# Patient Record
Sex: Male | Born: 1935 | Race: White | Hispanic: No | Marital: Married | Smoking: Former smoker
Health system: Southern US, Community
[De-identification: ages and names within clinical notes are randomized; demographics above are authoritative.]

## PROBLEM LIST (undated history)

## (undated) DIAGNOSIS — K219 Gastro-esophageal reflux disease without esophagitis: Secondary | ICD-10-CM

## (undated) DIAGNOSIS — E538 Deficiency of other specified B group vitamins: Secondary | ICD-10-CM

## (undated) DIAGNOSIS — G4700 Insomnia, unspecified: Secondary | ICD-10-CM

## (undated) DIAGNOSIS — E559 Vitamin D deficiency, unspecified: Secondary | ICD-10-CM

## (undated) DIAGNOSIS — E119 Type 2 diabetes mellitus without complications: Secondary | ICD-10-CM

## (undated) DIAGNOSIS — M1990 Unspecified osteoarthritis, unspecified site: Secondary | ICD-10-CM

## (undated) DIAGNOSIS — Z79891 Long term (current) use of opiate analgesic: Secondary | ICD-10-CM

## (undated) DIAGNOSIS — R413 Other amnesia: Secondary | ICD-10-CM

## (undated) DIAGNOSIS — E785 Hyperlipidemia, unspecified: Secondary | ICD-10-CM

## (undated) HISTORY — DX: Other amnesia: R41.3

## (undated) HISTORY — DX: Deficiency of other specified B group vitamins: E53.8

## (undated) HISTORY — DX: Gastro-esophageal reflux disease without esophagitis: K21.9

## (undated) HISTORY — DX: Unspecified osteoarthritis, unspecified site: M19.90

## (undated) HISTORY — DX: Hyperlipidemia, unspecified: E78.5

## (undated) HISTORY — DX: Long term (current) use of opiate analgesic: Z79.891

## (undated) HISTORY — DX: Type 2 diabetes mellitus without complications: E11.9

## (undated) HISTORY — DX: Insomnia, unspecified: G47.00

## (undated) HISTORY — DX: Vitamin D deficiency, unspecified: E55.9

---
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
## 2006-02-11 HISTORY — PX: CHOLECYSTECTOMY: SHX55

## 2011-02-26 DIAGNOSIS — E785 Hyperlipidemia, unspecified: Secondary | ICD-10-CM | POA: Diagnosis not present

## 2011-02-26 DIAGNOSIS — E119 Type 2 diabetes mellitus without complications: Secondary | ICD-10-CM | POA: Diagnosis not present

## 2011-02-26 DIAGNOSIS — I10 Essential (primary) hypertension: Secondary | ICD-10-CM | POA: Diagnosis not present

## 2011-02-26 DIAGNOSIS — Z6835 Body mass index (BMI) 35.0-35.9, adult: Secondary | ICD-10-CM | POA: Diagnosis not present

## 2011-02-26 DIAGNOSIS — Z125 Encounter for screening for malignant neoplasm of prostate: Secondary | ICD-10-CM | POA: Diagnosis not present

## 2011-02-26 DIAGNOSIS — G459 Transient cerebral ischemic attack, unspecified: Secondary | ICD-10-CM | POA: Diagnosis not present

## 2011-02-28 DIAGNOSIS — G459 Transient cerebral ischemic attack, unspecified: Secondary | ICD-10-CM | POA: Diagnosis not present

## 2011-12-20 DIAGNOSIS — Z23 Encounter for immunization: Secondary | ICD-10-CM | POA: Diagnosis not present

## 2011-12-20 DIAGNOSIS — M818 Other osteoporosis without current pathological fracture: Secondary | ICD-10-CM | POA: Diagnosis not present

## 2011-12-20 DIAGNOSIS — E785 Hyperlipidemia, unspecified: Secondary | ICD-10-CM | POA: Diagnosis not present

## 2011-12-20 DIAGNOSIS — D485 Neoplasm of uncertain behavior of skin: Secondary | ICD-10-CM | POA: Diagnosis not present

## 2011-12-20 DIAGNOSIS — E538 Deficiency of other specified B group vitamins: Secondary | ICD-10-CM | POA: Diagnosis not present

## 2011-12-20 DIAGNOSIS — R42 Dizziness and giddiness: Secondary | ICD-10-CM | POA: Diagnosis not present

## 2012-01-23 DIAGNOSIS — E881 Lipodystrophy, not elsewhere classified: Secondary | ICD-10-CM | POA: Diagnosis not present

## 2012-01-23 DIAGNOSIS — R7989 Other specified abnormal findings of blood chemistry: Secondary | ICD-10-CM | POA: Diagnosis not present

## 2012-01-23 DIAGNOSIS — E785 Hyperlipidemia, unspecified: Secondary | ICD-10-CM | POA: Diagnosis not present

## 2012-01-23 DIAGNOSIS — Z23 Encounter for immunization: Secondary | ICD-10-CM | POA: Diagnosis not present

## 2012-03-24 DIAGNOSIS — R7309 Other abnormal glucose: Secondary | ICD-10-CM | POA: Diagnosis not present

## 2012-03-24 DIAGNOSIS — E119 Type 2 diabetes mellitus without complications: Secondary | ICD-10-CM | POA: Diagnosis not present

## 2012-03-24 DIAGNOSIS — E785 Hyperlipidemia, unspecified: Secondary | ICD-10-CM | POA: Diagnosis not present

## 2012-04-04 DIAGNOSIS — E119 Type 2 diabetes mellitus without complications: Secondary | ICD-10-CM | POA: Diagnosis not present

## 2012-04-04 DIAGNOSIS — R300 Dysuria: Secondary | ICD-10-CM | POA: Diagnosis not present

## 2012-04-04 DIAGNOSIS — N135 Crossing vessel and stricture of ureter without hydronephrosis: Secondary | ICD-10-CM | POA: Diagnosis not present

## 2012-04-04 DIAGNOSIS — N1330 Unspecified hydronephrosis: Secondary | ICD-10-CM | POA: Diagnosis not present

## 2012-04-04 DIAGNOSIS — N209 Urinary calculus, unspecified: Secondary | ICD-10-CM | POA: Diagnosis not present

## 2012-04-04 DIAGNOSIS — R109 Unspecified abdominal pain: Secondary | ICD-10-CM | POA: Diagnosis not present

## 2012-04-04 DIAGNOSIS — N201 Calculus of ureter: Secondary | ICD-10-CM | POA: Diagnosis not present

## 2012-04-04 DIAGNOSIS — E860 Dehydration: Secondary | ICD-10-CM | POA: Diagnosis not present

## 2012-04-07 DIAGNOSIS — N200 Calculus of kidney: Secondary | ICD-10-CM | POA: Diagnosis not present

## 2012-04-07 DIAGNOSIS — R109 Unspecified abdominal pain: Secondary | ICD-10-CM | POA: Diagnosis not present

## 2012-04-10 DIAGNOSIS — E119 Type 2 diabetes mellitus without complications: Secondary | ICD-10-CM | POA: Diagnosis not present

## 2012-04-10 DIAGNOSIS — E669 Obesity, unspecified: Secondary | ICD-10-CM | POA: Diagnosis not present

## 2012-04-10 DIAGNOSIS — Z79899 Other long term (current) drug therapy: Secondary | ICD-10-CM | POA: Diagnosis not present

## 2012-04-10 DIAGNOSIS — N200 Calculus of kidney: Secondary | ICD-10-CM | POA: Diagnosis not present

## 2012-04-21 DIAGNOSIS — N200 Calculus of kidney: Secondary | ICD-10-CM | POA: Diagnosis not present

## 2012-04-21 DIAGNOSIS — N400 Enlarged prostate without lower urinary tract symptoms: Secondary | ICD-10-CM | POA: Diagnosis not present

## 2012-05-12 DIAGNOSIS — N400 Enlarged prostate without lower urinary tract symptoms: Secondary | ICD-10-CM | POA: Diagnosis not present

## 2012-05-12 DIAGNOSIS — N200 Calculus of kidney: Secondary | ICD-10-CM | POA: Diagnosis not present

## 2012-06-11 DIAGNOSIS — E119 Type 2 diabetes mellitus without complications: Secondary | ICD-10-CM | POA: Diagnosis not present

## 2012-06-11 DIAGNOSIS — H2589 Other age-related cataract: Secondary | ICD-10-CM | POA: Diagnosis not present

## 2012-06-23 DIAGNOSIS — E785 Hyperlipidemia, unspecified: Secondary | ICD-10-CM | POA: Diagnosis not present

## 2012-06-23 DIAGNOSIS — E119 Type 2 diabetes mellitus without complications: Secondary | ICD-10-CM | POA: Diagnosis not present

## 2012-08-24 DIAGNOSIS — N200 Calculus of kidney: Secondary | ICD-10-CM | POA: Diagnosis not present

## 2012-08-24 DIAGNOSIS — Z125 Encounter for screening for malignant neoplasm of prostate: Secondary | ICD-10-CM | POA: Diagnosis not present

## 2012-08-24 DIAGNOSIS — N400 Enlarged prostate without lower urinary tract symptoms: Secondary | ICD-10-CM | POA: Diagnosis not present

## 2012-09-23 DIAGNOSIS — E119 Type 2 diabetes mellitus without complications: Secondary | ICD-10-CM | POA: Diagnosis not present

## 2012-09-23 DIAGNOSIS — G459 Transient cerebral ischemic attack, unspecified: Secondary | ICD-10-CM | POA: Diagnosis not present

## 2012-09-23 DIAGNOSIS — N200 Calculus of kidney: Secondary | ICD-10-CM | POA: Diagnosis not present

## 2012-09-23 DIAGNOSIS — E785 Hyperlipidemia, unspecified: Secondary | ICD-10-CM | POA: Diagnosis not present

## 2012-11-30 DIAGNOSIS — L570 Actinic keratosis: Secondary | ICD-10-CM | POA: Diagnosis not present

## 2012-11-30 DIAGNOSIS — L578 Other skin changes due to chronic exposure to nonionizing radiation: Secondary | ICD-10-CM | POA: Diagnosis not present

## 2012-12-25 DIAGNOSIS — E119 Type 2 diabetes mellitus without complications: Secondary | ICD-10-CM | POA: Diagnosis not present

## 2012-12-25 DIAGNOSIS — Z23 Encounter for immunization: Secondary | ICD-10-CM | POA: Diagnosis not present

## 2012-12-25 DIAGNOSIS — E785 Hyperlipidemia, unspecified: Secondary | ICD-10-CM | POA: Diagnosis not present

## 2013-03-29 DIAGNOSIS — E785 Hyperlipidemia, unspecified: Secondary | ICD-10-CM | POA: Diagnosis not present

## 2013-03-29 DIAGNOSIS — E119 Type 2 diabetes mellitus without complications: Secondary | ICD-10-CM | POA: Diagnosis not present

## 2013-06-29 DIAGNOSIS — E785 Hyperlipidemia, unspecified: Secondary | ICD-10-CM | POA: Diagnosis not present

## 2013-06-29 DIAGNOSIS — M545 Low back pain, unspecified: Secondary | ICD-10-CM | POA: Diagnosis not present

## 2013-06-29 DIAGNOSIS — Z6834 Body mass index (BMI) 34.0-34.9, adult: Secondary | ICD-10-CM | POA: Diagnosis not present

## 2013-06-29 DIAGNOSIS — E119 Type 2 diabetes mellitus without complications: Secondary | ICD-10-CM | POA: Diagnosis not present

## 2013-09-29 DIAGNOSIS — Z125 Encounter for screening for malignant neoplasm of prostate: Secondary | ICD-10-CM | POA: Diagnosis not present

## 2013-09-29 DIAGNOSIS — E119 Type 2 diabetes mellitus without complications: Secondary | ICD-10-CM | POA: Diagnosis not present

## 2013-09-29 DIAGNOSIS — M25519 Pain in unspecified shoulder: Secondary | ICD-10-CM | POA: Diagnosis not present

## 2013-09-29 DIAGNOSIS — E785 Hyperlipidemia, unspecified: Secondary | ICD-10-CM | POA: Diagnosis not present

## 2013-09-29 DIAGNOSIS — Z0000 Encounter for general adult medical examination without abnormal findings: Secondary | ICD-10-CM | POA: Diagnosis not present

## 2013-09-30 DIAGNOSIS — E119 Type 2 diabetes mellitus without complications: Secondary | ICD-10-CM | POA: Diagnosis not present

## 2013-10-04 DIAGNOSIS — H40039 Anatomical narrow angle, unspecified eye: Secondary | ICD-10-CM | POA: Diagnosis not present

## 2013-10-04 DIAGNOSIS — H2510 Age-related nuclear cataract, unspecified eye: Secondary | ICD-10-CM | POA: Diagnosis not present

## 2013-10-04 DIAGNOSIS — H2589 Other age-related cataract: Secondary | ICD-10-CM | POA: Diagnosis not present

## 2013-11-10 DIAGNOSIS — H2589 Other age-related cataract: Secondary | ICD-10-CM | POA: Diagnosis not present

## 2013-11-10 DIAGNOSIS — H2510 Age-related nuclear cataract, unspecified eye: Secondary | ICD-10-CM | POA: Diagnosis not present

## 2013-12-30 DIAGNOSIS — M25511 Pain in right shoulder: Secondary | ICD-10-CM | POA: Diagnosis not present

## 2013-12-30 DIAGNOSIS — E785 Hyperlipidemia, unspecified: Secondary | ICD-10-CM | POA: Diagnosis not present

## 2013-12-30 DIAGNOSIS — E119 Type 2 diabetes mellitus without complications: Secondary | ICD-10-CM | POA: Diagnosis not present

## 2013-12-30 DIAGNOSIS — G4700 Insomnia, unspecified: Secondary | ICD-10-CM | POA: Diagnosis not present

## 2014-03-01 DIAGNOSIS — J189 Pneumonia, unspecified organism: Secondary | ICD-10-CM | POA: Diagnosis not present

## 2014-03-01 DIAGNOSIS — E119 Type 2 diabetes mellitus without complications: Secondary | ICD-10-CM | POA: Diagnosis not present

## 2014-03-01 DIAGNOSIS — E785 Hyperlipidemia, unspecified: Secondary | ICD-10-CM | POA: Diagnosis not present

## 2014-03-14 DIAGNOSIS — R079 Chest pain, unspecified: Secondary | ICD-10-CM | POA: Diagnosis not present

## 2014-03-14 DIAGNOSIS — F419 Anxiety disorder, unspecified: Secondary | ICD-10-CM | POA: Diagnosis not present

## 2014-03-14 DIAGNOSIS — R0602 Shortness of breath: Secondary | ICD-10-CM | POA: Diagnosis not present

## 2014-03-15 DIAGNOSIS — R791 Abnormal coagulation profile: Secondary | ICD-10-CM | POA: Diagnosis not present

## 2014-03-15 DIAGNOSIS — I313 Pericardial effusion (noninflammatory): Secondary | ICD-10-CM | POA: Diagnosis not present

## 2014-03-15 DIAGNOSIS — I517 Cardiomegaly: Secondary | ICD-10-CM | POA: Diagnosis not present

## 2014-03-15 DIAGNOSIS — R0602 Shortness of breath: Secondary | ICD-10-CM | POA: Diagnosis not present

## 2014-03-17 DIAGNOSIS — R0789 Other chest pain: Secondary | ICD-10-CM | POA: Diagnosis not present

## 2014-03-17 DIAGNOSIS — R079 Chest pain, unspecified: Secondary | ICD-10-CM | POA: Diagnosis not present

## 2014-04-01 DIAGNOSIS — J9801 Acute bronchospasm: Secondary | ICD-10-CM | POA: Diagnosis not present

## 2014-04-01 DIAGNOSIS — M1990 Unspecified osteoarthritis, unspecified site: Secondary | ICD-10-CM | POA: Diagnosis not present

## 2014-04-01 DIAGNOSIS — E785 Hyperlipidemia, unspecified: Secondary | ICD-10-CM | POA: Diagnosis not present

## 2014-04-01 DIAGNOSIS — E119 Type 2 diabetes mellitus without complications: Secondary | ICD-10-CM | POA: Diagnosis not present

## 2014-04-01 DIAGNOSIS — G4700 Insomnia, unspecified: Secondary | ICD-10-CM | POA: Diagnosis not present

## 2014-04-20 DIAGNOSIS — S2341XA Sprain of ribs, initial encounter: Secondary | ICD-10-CM | POA: Diagnosis not present

## 2014-04-20 DIAGNOSIS — S20219A Contusion of unspecified front wall of thorax, initial encounter: Secondary | ICD-10-CM | POA: Diagnosis not present

## 2014-04-20 DIAGNOSIS — R071 Chest pain on breathing: Secondary | ICD-10-CM | POA: Diagnosis not present

## 2014-04-20 DIAGNOSIS — I517 Cardiomegaly: Secondary | ICD-10-CM | POA: Diagnosis not present

## 2014-06-29 DIAGNOSIS — R7309 Other abnormal glucose: Secondary | ICD-10-CM | POA: Diagnosis not present

## 2014-06-29 DIAGNOSIS — E785 Hyperlipidemia, unspecified: Secondary | ICD-10-CM | POA: Diagnosis not present

## 2014-06-29 DIAGNOSIS — Z6834 Body mass index (BMI) 34.0-34.9, adult: Secondary | ICD-10-CM | POA: Diagnosis not present

## 2014-07-01 DIAGNOSIS — E119 Type 2 diabetes mellitus without complications: Secondary | ICD-10-CM | POA: Diagnosis not present

## 2014-07-01 DIAGNOSIS — E785 Hyperlipidemia, unspecified: Secondary | ICD-10-CM | POA: Diagnosis not present

## 2014-10-07 DIAGNOSIS — Z79899 Other long term (current) drug therapy: Secondary | ICD-10-CM | POA: Diagnosis not present

## 2014-10-07 DIAGNOSIS — M1990 Unspecified osteoarthritis, unspecified site: Secondary | ICD-10-CM | POA: Diagnosis not present

## 2014-10-07 DIAGNOSIS — Z1389 Encounter for screening for other disorder: Secondary | ICD-10-CM | POA: Diagnosis not present

## 2014-10-07 DIAGNOSIS — E785 Hyperlipidemia, unspecified: Secondary | ICD-10-CM | POA: Diagnosis not present

## 2014-10-07 DIAGNOSIS — E119 Type 2 diabetes mellitus without complications: Secondary | ICD-10-CM | POA: Diagnosis not present

## 2014-10-07 DIAGNOSIS — M25511 Pain in right shoulder: Secondary | ICD-10-CM | POA: Diagnosis not present

## 2014-10-07 DIAGNOSIS — M818 Other osteoporosis without current pathological fracture: Secondary | ICD-10-CM | POA: Diagnosis not present

## 2014-10-07 DIAGNOSIS — Z9181 History of falling: Secondary | ICD-10-CM | POA: Diagnosis not present

## 2014-10-10 DIAGNOSIS — E119 Type 2 diabetes mellitus without complications: Secondary | ICD-10-CM | POA: Diagnosis not present

## 2014-11-09 DIAGNOSIS — E538 Deficiency of other specified B group vitamins: Secondary | ICD-10-CM | POA: Diagnosis not present

## 2014-11-18 DIAGNOSIS — E669 Obesity, unspecified: Secondary | ICD-10-CM | POA: Diagnosis not present

## 2014-11-18 DIAGNOSIS — R079 Chest pain, unspecified: Secondary | ICD-10-CM | POA: Diagnosis not present

## 2014-11-18 DIAGNOSIS — R072 Precordial pain: Secondary | ICD-10-CM | POA: Diagnosis not present

## 2014-11-18 DIAGNOSIS — N189 Chronic kidney disease, unspecified: Secondary | ICD-10-CM | POA: Diagnosis not present

## 2014-11-18 DIAGNOSIS — Z7982 Long term (current) use of aspirin: Secondary | ICD-10-CM | POA: Diagnosis not present

## 2014-11-18 DIAGNOSIS — Z79899 Other long term (current) drug therapy: Secondary | ICD-10-CM | POA: Diagnosis not present

## 2014-11-18 DIAGNOSIS — I249 Acute ischemic heart disease, unspecified: Secondary | ICD-10-CM | POA: Diagnosis not present

## 2014-11-18 DIAGNOSIS — I129 Hypertensive chronic kidney disease with stage 1 through stage 4 chronic kidney disease, or unspecified chronic kidney disease: Secondary | ICD-10-CM | POA: Diagnosis not present

## 2014-11-18 DIAGNOSIS — E119 Type 2 diabetes mellitus without complications: Secondary | ICD-10-CM | POA: Diagnosis not present

## 2014-11-18 DIAGNOSIS — E785 Hyperlipidemia, unspecified: Secondary | ICD-10-CM | POA: Diagnosis not present

## 2014-11-18 DIAGNOSIS — R509 Fever, unspecified: Secondary | ICD-10-CM | POA: Diagnosis not present

## 2014-11-19 DIAGNOSIS — N189 Chronic kidney disease, unspecified: Secondary | ICD-10-CM | POA: Diagnosis not present

## 2014-11-19 DIAGNOSIS — I447 Left bundle-branch block, unspecified: Secondary | ICD-10-CM | POA: Diagnosis not present

## 2014-11-19 DIAGNOSIS — R079 Chest pain, unspecified: Secondary | ICD-10-CM | POA: Diagnosis not present

## 2014-11-19 DIAGNOSIS — E785 Hyperlipidemia, unspecified: Secondary | ICD-10-CM | POA: Diagnosis not present

## 2014-11-19 DIAGNOSIS — E119 Type 2 diabetes mellitus without complications: Secondary | ICD-10-CM | POA: Diagnosis not present

## 2014-11-21 DIAGNOSIS — Z23 Encounter for immunization: Secondary | ICD-10-CM | POA: Diagnosis not present

## 2014-12-09 DIAGNOSIS — E538 Deficiency of other specified B group vitamins: Secondary | ICD-10-CM | POA: Diagnosis not present

## 2014-12-12 DIAGNOSIS — R072 Precordial pain: Secondary | ICD-10-CM

## 2014-12-12 DIAGNOSIS — I447 Left bundle-branch block, unspecified: Secondary | ICD-10-CM | POA: Insufficient documentation

## 2014-12-12 DIAGNOSIS — R0789 Other chest pain: Secondary | ICD-10-CM | POA: Diagnosis not present

## 2014-12-12 DIAGNOSIS — E785 Hyperlipidemia, unspecified: Secondary | ICD-10-CM | POA: Insufficient documentation

## 2014-12-12 DIAGNOSIS — E119 Type 2 diabetes mellitus without complications: Secondary | ICD-10-CM | POA: Diagnosis not present

## 2014-12-12 HISTORY — DX: Precordial pain: R07.2

## 2014-12-12 HISTORY — DX: Hyperlipidemia, unspecified: E78.5

## 2014-12-22 DIAGNOSIS — I447 Left bundle-branch block, unspecified: Secondary | ICD-10-CM | POA: Diagnosis not present

## 2014-12-22 DIAGNOSIS — R072 Precordial pain: Secondary | ICD-10-CM | POA: Diagnosis not present

## 2015-01-09 DIAGNOSIS — Z6834 Body mass index (BMI) 34.0-34.9, adult: Secondary | ICD-10-CM | POA: Diagnosis not present

## 2015-01-09 DIAGNOSIS — E785 Hyperlipidemia, unspecified: Secondary | ICD-10-CM | POA: Diagnosis not present

## 2015-01-09 DIAGNOSIS — E119 Type 2 diabetes mellitus without complications: Secondary | ICD-10-CM | POA: Diagnosis not present

## 2015-01-12 DIAGNOSIS — R072 Precordial pain: Secondary | ICD-10-CM | POA: Diagnosis not present

## 2015-01-12 DIAGNOSIS — I447 Left bundle-branch block, unspecified: Secondary | ICD-10-CM | POA: Diagnosis not present

## 2015-01-12 DIAGNOSIS — N529 Male erectile dysfunction, unspecified: Secondary | ICD-10-CM

## 2015-01-12 DIAGNOSIS — E119 Type 2 diabetes mellitus without complications: Secondary | ICD-10-CM | POA: Diagnosis not present

## 2015-01-12 DIAGNOSIS — E785 Hyperlipidemia, unspecified: Secondary | ICD-10-CM | POA: Diagnosis not present

## 2015-01-12 HISTORY — DX: Male erectile dysfunction, unspecified: N52.9

## 2015-01-20 DIAGNOSIS — E785 Hyperlipidemia, unspecified: Secondary | ICD-10-CM | POA: Diagnosis not present

## 2015-01-20 DIAGNOSIS — E119 Type 2 diabetes mellitus without complications: Secondary | ICD-10-CM | POA: Diagnosis not present

## 2015-03-21 DIAGNOSIS — Z6835 Body mass index (BMI) 35.0-35.9, adult: Secondary | ICD-10-CM | POA: Diagnosis not present

## 2015-03-21 DIAGNOSIS — R6889 Other general symptoms and signs: Secondary | ICD-10-CM | POA: Diagnosis not present

## 2015-03-21 DIAGNOSIS — G4700 Insomnia, unspecified: Secondary | ICD-10-CM | POA: Diagnosis not present

## 2015-03-21 DIAGNOSIS — J101 Influenza due to other identified influenza virus with other respiratory manifestations: Secondary | ICD-10-CM | POA: Diagnosis not present

## 2015-03-21 DIAGNOSIS — R0602 Shortness of breath: Secondary | ICD-10-CM | POA: Diagnosis not present

## 2015-03-21 DIAGNOSIS — J1108 Influenza due to unidentified influenza virus with specified pneumonia: Secondary | ICD-10-CM | POA: Diagnosis not present

## 2015-03-21 DIAGNOSIS — J189 Pneumonia, unspecified organism: Secondary | ICD-10-CM | POA: Diagnosis not present

## 2015-03-27 DIAGNOSIS — G4700 Insomnia, unspecified: Secondary | ICD-10-CM | POA: Diagnosis not present

## 2015-03-27 DIAGNOSIS — F419 Anxiety disorder, unspecified: Secondary | ICD-10-CM | POA: Diagnosis not present

## 2015-03-27 DIAGNOSIS — Z7189 Other specified counseling: Secondary | ICD-10-CM | POA: Diagnosis not present

## 2015-03-27 DIAGNOSIS — J189 Pneumonia, unspecified organism: Secondary | ICD-10-CM | POA: Diagnosis not present

## 2015-04-11 DIAGNOSIS — E119 Type 2 diabetes mellitus without complications: Secondary | ICD-10-CM | POA: Diagnosis not present

## 2015-04-11 DIAGNOSIS — Z6835 Body mass index (BMI) 35.0-35.9, adult: Secondary | ICD-10-CM | POA: Diagnosis not present

## 2015-04-11 DIAGNOSIS — E669 Obesity, unspecified: Secondary | ICD-10-CM | POA: Diagnosis not present

## 2015-04-11 DIAGNOSIS — Z79899 Other long term (current) drug therapy: Secondary | ICD-10-CM | POA: Diagnosis not present

## 2015-04-11 DIAGNOSIS — Z125 Encounter for screening for malignant neoplasm of prostate: Secondary | ICD-10-CM | POA: Diagnosis not present

## 2015-04-11 DIAGNOSIS — E785 Hyperlipidemia, unspecified: Secondary | ICD-10-CM | POA: Diagnosis not present

## 2015-04-18 DIAGNOSIS — E538 Deficiency of other specified B group vitamins: Secondary | ICD-10-CM | POA: Diagnosis not present

## 2015-05-15 DIAGNOSIS — B3781 Candidal esophagitis: Secondary | ICD-10-CM | POA: Diagnosis not present

## 2015-05-15 DIAGNOSIS — E538 Deficiency of other specified B group vitamins: Secondary | ICD-10-CM | POA: Diagnosis not present

## 2015-06-22 DIAGNOSIS — E119 Type 2 diabetes mellitus without complications: Secondary | ICD-10-CM | POA: Diagnosis not present

## 2015-06-30 DIAGNOSIS — E119 Type 2 diabetes mellitus without complications: Secondary | ICD-10-CM | POA: Diagnosis not present

## 2015-06-30 DIAGNOSIS — J189 Pneumonia, unspecified organism: Secondary | ICD-10-CM | POA: Diagnosis not present

## 2015-06-30 DIAGNOSIS — J9801 Acute bronchospasm: Secondary | ICD-10-CM | POA: Diagnosis not present

## 2015-06-30 DIAGNOSIS — E785 Hyperlipidemia, unspecified: Secondary | ICD-10-CM | POA: Diagnosis not present

## 2015-07-11 DIAGNOSIS — R0602 Shortness of breath: Secondary | ICD-10-CM | POA: Diagnosis not present

## 2015-07-11 DIAGNOSIS — G4700 Insomnia, unspecified: Secondary | ICD-10-CM | POA: Diagnosis not present

## 2015-07-11 DIAGNOSIS — Z6834 Body mass index (BMI) 34.0-34.9, adult: Secondary | ICD-10-CM | POA: Diagnosis not present

## 2015-07-11 DIAGNOSIS — E785 Hyperlipidemia, unspecified: Secondary | ICD-10-CM | POA: Diagnosis not present

## 2015-07-11 DIAGNOSIS — J189 Pneumonia, unspecified organism: Secondary | ICD-10-CM | POA: Diagnosis not present

## 2015-07-11 DIAGNOSIS — L570 Actinic keratosis: Secondary | ICD-10-CM | POA: Diagnosis not present

## 2015-07-11 DIAGNOSIS — J449 Chronic obstructive pulmonary disease, unspecified: Secondary | ICD-10-CM | POA: Diagnosis not present

## 2015-07-11 DIAGNOSIS — E119 Type 2 diabetes mellitus without complications: Secondary | ICD-10-CM | POA: Diagnosis not present

## 2015-08-04 DIAGNOSIS — Z0000 Encounter for general adult medical examination without abnormal findings: Secondary | ICD-10-CM | POA: Diagnosis not present

## 2015-08-04 DIAGNOSIS — Z23 Encounter for immunization: Secondary | ICD-10-CM | POA: Diagnosis not present

## 2015-08-04 DIAGNOSIS — E785 Hyperlipidemia, unspecified: Secondary | ICD-10-CM | POA: Diagnosis not present

## 2015-08-04 DIAGNOSIS — E669 Obesity, unspecified: Secondary | ICD-10-CM | POA: Diagnosis not present

## 2015-08-04 DIAGNOSIS — Z6834 Body mass index (BMI) 34.0-34.9, adult: Secondary | ICD-10-CM | POA: Diagnosis not present

## 2015-10-12 DIAGNOSIS — M1990 Unspecified osteoarthritis, unspecified site: Secondary | ICD-10-CM | POA: Diagnosis not present

## 2015-10-12 DIAGNOSIS — E669 Obesity, unspecified: Secondary | ICD-10-CM | POA: Diagnosis not present

## 2015-10-12 DIAGNOSIS — E785 Hyperlipidemia, unspecified: Secondary | ICD-10-CM | POA: Diagnosis not present

## 2015-10-12 DIAGNOSIS — Z1389 Encounter for screening for other disorder: Secondary | ICD-10-CM | POA: Diagnosis not present

## 2015-10-12 DIAGNOSIS — G4700 Insomnia, unspecified: Secondary | ICD-10-CM | POA: Diagnosis not present

## 2015-10-12 DIAGNOSIS — Z6833 Body mass index (BMI) 33.0-33.9, adult: Secondary | ICD-10-CM | POA: Diagnosis not present

## 2015-10-12 DIAGNOSIS — E119 Type 2 diabetes mellitus without complications: Secondary | ICD-10-CM | POA: Diagnosis not present

## 2015-10-12 DIAGNOSIS — Z9181 History of falling: Secondary | ICD-10-CM | POA: Diagnosis not present

## 2015-12-26 DIAGNOSIS — H353131 Nonexudative age-related macular degeneration, bilateral, early dry stage: Secondary | ICD-10-CM | POA: Diagnosis not present

## 2015-12-26 DIAGNOSIS — E119 Type 2 diabetes mellitus without complications: Secondary | ICD-10-CM | POA: Diagnosis not present

## 2015-12-26 DIAGNOSIS — Z961 Presence of intraocular lens: Secondary | ICD-10-CM | POA: Diagnosis not present

## 2016-01-12 DIAGNOSIS — Z6833 Body mass index (BMI) 33.0-33.9, adult: Secondary | ICD-10-CM | POA: Diagnosis not present

## 2016-01-12 DIAGNOSIS — G4700 Insomnia, unspecified: Secondary | ICD-10-CM | POA: Diagnosis not present

## 2016-01-12 DIAGNOSIS — E119 Type 2 diabetes mellitus without complications: Secondary | ICD-10-CM | POA: Diagnosis not present

## 2016-01-12 DIAGNOSIS — E785 Hyperlipidemia, unspecified: Secondary | ICD-10-CM | POA: Diagnosis not present

## 2016-01-12 DIAGNOSIS — Z23 Encounter for immunization: Secondary | ICD-10-CM | POA: Diagnosis not present

## 2016-01-12 DIAGNOSIS — N529 Male erectile dysfunction, unspecified: Secondary | ICD-10-CM | POA: Diagnosis not present

## 2016-01-12 DIAGNOSIS — E669 Obesity, unspecified: Secondary | ICD-10-CM | POA: Diagnosis not present

## 2016-01-25 DIAGNOSIS — B349 Viral infection, unspecified: Secondary | ICD-10-CM | POA: Diagnosis not present

## 2016-01-25 DIAGNOSIS — Z6833 Body mass index (BMI) 33.0-33.9, adult: Secondary | ICD-10-CM | POA: Diagnosis not present

## 2016-01-25 DIAGNOSIS — J189 Pneumonia, unspecified organism: Secondary | ICD-10-CM | POA: Diagnosis not present

## 2016-01-25 DIAGNOSIS — J301 Allergic rhinitis due to pollen: Secondary | ICD-10-CM | POA: Diagnosis not present

## 2016-03-14 DIAGNOSIS — R6889 Other general symptoms and signs: Secondary | ICD-10-CM | POA: Diagnosis not present

## 2016-03-14 DIAGNOSIS — Z6833 Body mass index (BMI) 33.0-33.9, adult: Secondary | ICD-10-CM | POA: Diagnosis not present

## 2016-03-14 DIAGNOSIS — J189 Pneumonia, unspecified organism: Secondary | ICD-10-CM | POA: Diagnosis not present

## 2016-03-14 DIAGNOSIS — J111 Influenza due to unidentified influenza virus with other respiratory manifestations: Secondary | ICD-10-CM | POA: Diagnosis not present

## 2016-04-12 DIAGNOSIS — E538 Deficiency of other specified B group vitamins: Secondary | ICD-10-CM | POA: Diagnosis not present

## 2016-04-12 DIAGNOSIS — E119 Type 2 diabetes mellitus without complications: Secondary | ICD-10-CM | POA: Diagnosis not present

## 2016-04-12 DIAGNOSIS — Z79899 Other long term (current) drug therapy: Secondary | ICD-10-CM | POA: Diagnosis not present

## 2016-04-12 DIAGNOSIS — E785 Hyperlipidemia, unspecified: Secondary | ICD-10-CM | POA: Diagnosis not present

## 2016-04-12 DIAGNOSIS — G4700 Insomnia, unspecified: Secondary | ICD-10-CM | POA: Diagnosis not present

## 2016-04-12 DIAGNOSIS — Z6833 Body mass index (BMI) 33.0-33.9, adult: Secondary | ICD-10-CM | POA: Diagnosis not present

## 2016-07-15 DIAGNOSIS — E538 Deficiency of other specified B group vitamins: Secondary | ICD-10-CM | POA: Diagnosis not present

## 2016-07-15 DIAGNOSIS — E785 Hyperlipidemia, unspecified: Secondary | ICD-10-CM | POA: Diagnosis not present

## 2016-07-15 DIAGNOSIS — G4700 Insomnia, unspecified: Secondary | ICD-10-CM | POA: Diagnosis not present

## 2016-07-15 DIAGNOSIS — Z125 Encounter for screening for malignant neoplasm of prostate: Secondary | ICD-10-CM | POA: Diagnosis not present

## 2016-07-15 DIAGNOSIS — E119 Type 2 diabetes mellitus without complications: Secondary | ICD-10-CM | POA: Diagnosis not present

## 2016-07-15 DIAGNOSIS — Z6833 Body mass index (BMI) 33.0-33.9, adult: Secondary | ICD-10-CM | POA: Diagnosis not present

## 2016-09-02 DIAGNOSIS — E785 Hyperlipidemia, unspecified: Secondary | ICD-10-CM | POA: Diagnosis not present

## 2016-09-02 DIAGNOSIS — E119 Type 2 diabetes mellitus without complications: Secondary | ICD-10-CM | POA: Diagnosis not present

## 2016-09-02 DIAGNOSIS — E669 Obesity, unspecified: Secondary | ICD-10-CM | POA: Diagnosis not present

## 2016-09-02 DIAGNOSIS — Z6833 Body mass index (BMI) 33.0-33.9, adult: Secondary | ICD-10-CM | POA: Diagnosis not present

## 2016-09-02 DIAGNOSIS — I200 Unstable angina: Secondary | ICD-10-CM | POA: Diagnosis not present

## 2016-09-02 DIAGNOSIS — Z79899 Other long term (current) drug therapy: Secondary | ICD-10-CM | POA: Diagnosis not present

## 2016-09-02 DIAGNOSIS — R079 Chest pain, unspecified: Secondary | ICD-10-CM | POA: Diagnosis not present

## 2016-09-03 DIAGNOSIS — E119 Type 2 diabetes mellitus without complications: Secondary | ICD-10-CM | POA: Diagnosis not present

## 2016-09-03 DIAGNOSIS — E785 Hyperlipidemia, unspecified: Secondary | ICD-10-CM | POA: Diagnosis not present

## 2016-09-03 DIAGNOSIS — R7989 Other specified abnormal findings of blood chemistry: Secondary | ICD-10-CM | POA: Diagnosis not present

## 2016-09-03 DIAGNOSIS — R079 Chest pain, unspecified: Secondary | ICD-10-CM | POA: Diagnosis not present

## 2016-09-04 DIAGNOSIS — F419 Anxiety disorder, unspecified: Secondary | ICD-10-CM | POA: Diagnosis not present

## 2016-09-04 DIAGNOSIS — E119 Type 2 diabetes mellitus without complications: Secondary | ICD-10-CM | POA: Diagnosis not present

## 2016-09-04 DIAGNOSIS — Z79899 Other long term (current) drug therapy: Secondary | ICD-10-CM | POA: Diagnosis not present

## 2016-09-04 DIAGNOSIS — N529 Male erectile dysfunction, unspecified: Secondary | ICD-10-CM | POA: Diagnosis not present

## 2016-09-04 DIAGNOSIS — Z6832 Body mass index (BMI) 32.0-32.9, adult: Secondary | ICD-10-CM | POA: Diagnosis not present

## 2016-09-04 DIAGNOSIS — E785 Hyperlipidemia, unspecified: Secondary | ICD-10-CM | POA: Diagnosis not present

## 2016-09-04 DIAGNOSIS — J9801 Acute bronchospasm: Secondary | ICD-10-CM | POA: Diagnosis not present

## 2016-09-04 DIAGNOSIS — S22030A Wedge compression fracture of third thoracic vertebra, initial encounter for closed fracture: Secondary | ICD-10-CM | POA: Diagnosis not present

## 2016-10-16 DIAGNOSIS — F419 Anxiety disorder, unspecified: Secondary | ICD-10-CM | POA: Diagnosis not present

## 2016-10-16 DIAGNOSIS — Z6833 Body mass index (BMI) 33.0-33.9, adult: Secondary | ICD-10-CM | POA: Diagnosis not present

## 2016-10-16 DIAGNOSIS — E785 Hyperlipidemia, unspecified: Secondary | ICD-10-CM | POA: Diagnosis not present

## 2016-10-16 DIAGNOSIS — N529 Male erectile dysfunction, unspecified: Secondary | ICD-10-CM | POA: Diagnosis not present

## 2016-10-16 DIAGNOSIS — E119 Type 2 diabetes mellitus without complications: Secondary | ICD-10-CM | POA: Diagnosis not present

## 2016-10-25 DIAGNOSIS — R040 Epistaxis: Secondary | ICD-10-CM | POA: Diagnosis not present

## 2016-10-28 DIAGNOSIS — R791 Abnormal coagulation profile: Secondary | ICD-10-CM | POA: Diagnosis not present

## 2016-10-28 DIAGNOSIS — Z6833 Body mass index (BMI) 33.0-33.9, adult: Secondary | ICD-10-CM | POA: Diagnosis not present

## 2016-10-28 DIAGNOSIS — Z7901 Long term (current) use of anticoagulants: Secondary | ICD-10-CM | POA: Diagnosis not present

## 2016-10-28 DIAGNOSIS — R040 Epistaxis: Secondary | ICD-10-CM | POA: Diagnosis not present

## 2016-11-19 DIAGNOSIS — R040 Epistaxis: Secondary | ICD-10-CM | POA: Diagnosis not present

## 2017-01-16 DIAGNOSIS — Z1331 Encounter for screening for depression: Secondary | ICD-10-CM | POA: Diagnosis not present

## 2017-01-16 DIAGNOSIS — E785 Hyperlipidemia, unspecified: Secondary | ICD-10-CM | POA: Diagnosis not present

## 2017-01-16 DIAGNOSIS — M818 Other osteoporosis without current pathological fracture: Secondary | ICD-10-CM | POA: Diagnosis not present

## 2017-01-16 DIAGNOSIS — Z6833 Body mass index (BMI) 33.0-33.9, adult: Secondary | ICD-10-CM | POA: Diagnosis not present

## 2017-01-16 DIAGNOSIS — E119 Type 2 diabetes mellitus without complications: Secondary | ICD-10-CM | POA: Diagnosis not present

## 2017-01-16 DIAGNOSIS — Z79899 Other long term (current) drug therapy: Secondary | ICD-10-CM | POA: Diagnosis not present

## 2017-01-16 DIAGNOSIS — Z23 Encounter for immunization: Secondary | ICD-10-CM | POA: Diagnosis not present

## 2017-01-16 DIAGNOSIS — N529 Male erectile dysfunction, unspecified: Secondary | ICD-10-CM | POA: Diagnosis not present

## 2017-01-16 DIAGNOSIS — Z9181 History of falling: Secondary | ICD-10-CM | POA: Diagnosis not present

## 2017-01-16 DIAGNOSIS — G4700 Insomnia, unspecified: Secondary | ICD-10-CM | POA: Diagnosis not present

## 2018-01-22 DIAGNOSIS — R413 Other amnesia: Secondary | ICD-10-CM | POA: Diagnosis not present

## 2018-01-22 DIAGNOSIS — Z1331 Encounter for screening for depression: Secondary | ICD-10-CM | POA: Diagnosis not present

## 2018-01-22 DIAGNOSIS — Z6832 Body mass index (BMI) 32.0-32.9, adult: Secondary | ICD-10-CM | POA: Diagnosis not present

## 2018-01-22 DIAGNOSIS — G4700 Insomnia, unspecified: Secondary | ICD-10-CM | POA: Diagnosis not present

## 2018-01-22 DIAGNOSIS — E118 Type 2 diabetes mellitus with unspecified complications: Secondary | ICD-10-CM | POA: Diagnosis not present

## 2018-01-22 DIAGNOSIS — Z9181 History of falling: Secondary | ICD-10-CM | POA: Diagnosis not present

## 2018-01-22 DIAGNOSIS — E785 Hyperlipidemia, unspecified: Secondary | ICD-10-CM | POA: Diagnosis not present

## 2018-02-12 DIAGNOSIS — K922 Gastrointestinal hemorrhage, unspecified: Secondary | ICD-10-CM | POA: Diagnosis not present

## 2018-02-12 DIAGNOSIS — R42 Dizziness and giddiness: Secondary | ICD-10-CM | POA: Diagnosis not present

## 2018-02-12 DIAGNOSIS — R6889 Other general symptoms and signs: Secondary | ICD-10-CM | POA: Diagnosis not present

## 2018-02-12 DIAGNOSIS — Z6832 Body mass index (BMI) 32.0-32.9, adult: Secondary | ICD-10-CM | POA: Diagnosis not present

## 2018-02-12 DIAGNOSIS — R1013 Epigastric pain: Secondary | ICD-10-CM | POA: Diagnosis not present

## 2018-02-13 DIAGNOSIS — G450 Vertebro-basilar artery syndrome: Secondary | ICD-10-CM | POA: Diagnosis not present

## 2018-02-13 DIAGNOSIS — K922 Gastrointestinal hemorrhage, unspecified: Secondary | ICD-10-CM | POA: Diagnosis not present

## 2018-02-13 DIAGNOSIS — R42 Dizziness and giddiness: Secondary | ICD-10-CM | POA: Diagnosis not present

## 2018-02-13 DIAGNOSIS — Z6832 Body mass index (BMI) 32.0-32.9, adult: Secondary | ICD-10-CM | POA: Diagnosis not present

## 2018-03-05 DIAGNOSIS — E119 Type 2 diabetes mellitus without complications: Secondary | ICD-10-CM | POA: Diagnosis not present

## 2018-03-05 DIAGNOSIS — H353131 Nonexudative age-related macular degeneration, bilateral, early dry stage: Secondary | ICD-10-CM | POA: Diagnosis not present

## 2018-03-05 DIAGNOSIS — Z961 Presence of intraocular lens: Secondary | ICD-10-CM | POA: Diagnosis not present

## 2018-03-31 DIAGNOSIS — J101 Influenza due to other identified influenza virus with other respiratory manifestations: Secondary | ICD-10-CM | POA: Diagnosis not present

## 2018-03-31 DIAGNOSIS — J020 Streptococcal pharyngitis: Secondary | ICD-10-CM | POA: Diagnosis not present

## 2018-03-31 DIAGNOSIS — Z6833 Body mass index (BMI) 33.0-33.9, adult: Secondary | ICD-10-CM | POA: Diagnosis not present

## 2018-03-31 DIAGNOSIS — R51 Headache: Secondary | ICD-10-CM | POA: Diagnosis not present

## 2018-03-31 DIAGNOSIS — J301 Allergic rhinitis due to pollen: Secondary | ICD-10-CM | POA: Diagnosis not present

## 2018-03-31 DIAGNOSIS — B349 Viral infection, unspecified: Secondary | ICD-10-CM | POA: Diagnosis not present

## 2018-04-20 DIAGNOSIS — R6889 Other general symptoms and signs: Secondary | ICD-10-CM | POA: Diagnosis not present

## 2018-04-20 DIAGNOSIS — B349 Viral infection, unspecified: Secondary | ICD-10-CM | POA: Diagnosis not present

## 2018-04-20 DIAGNOSIS — J020 Streptococcal pharyngitis: Secondary | ICD-10-CM | POA: Diagnosis not present

## 2018-04-20 DIAGNOSIS — J029 Acute pharyngitis, unspecified: Secondary | ICD-10-CM | POA: Diagnosis not present

## 2018-04-20 DIAGNOSIS — H1010 Acute atopic conjunctivitis, unspecified eye: Secondary | ICD-10-CM | POA: Diagnosis not present

## 2018-04-20 DIAGNOSIS — Z6832 Body mass index (BMI) 32.0-32.9, adult: Secondary | ICD-10-CM | POA: Diagnosis not present

## 2018-04-20 DIAGNOSIS — J301 Allergic rhinitis due to pollen: Secondary | ICD-10-CM | POA: Diagnosis not present

## 2018-04-28 DIAGNOSIS — J301 Allergic rhinitis due to pollen: Secondary | ICD-10-CM | POA: Diagnosis not present

## 2018-04-28 DIAGNOSIS — R1013 Epigastric pain: Secondary | ICD-10-CM | POA: Diagnosis not present

## 2018-04-28 DIAGNOSIS — R413 Other amnesia: Secondary | ICD-10-CM | POA: Diagnosis not present

## 2018-04-28 DIAGNOSIS — Z6831 Body mass index (BMI) 31.0-31.9, adult: Secondary | ICD-10-CM | POA: Diagnosis not present

## 2018-04-28 DIAGNOSIS — E559 Vitamin D deficiency, unspecified: Secondary | ICD-10-CM | POA: Diagnosis not present

## 2018-04-28 DIAGNOSIS — G4700 Insomnia, unspecified: Secondary | ICD-10-CM | POA: Diagnosis not present

## 2018-04-28 DIAGNOSIS — E785 Hyperlipidemia, unspecified: Secondary | ICD-10-CM | POA: Diagnosis not present

## 2018-04-28 DIAGNOSIS — E118 Type 2 diabetes mellitus with unspecified complications: Secondary | ICD-10-CM | POA: Diagnosis not present

## 2018-07-31 DIAGNOSIS — R413 Other amnesia: Secondary | ICD-10-CM | POA: Diagnosis not present

## 2018-07-31 DIAGNOSIS — J9801 Acute bronchospasm: Secondary | ICD-10-CM | POA: Diagnosis not present

## 2018-07-31 DIAGNOSIS — E559 Vitamin D deficiency, unspecified: Secondary | ICD-10-CM | POA: Diagnosis not present

## 2018-07-31 DIAGNOSIS — E785 Hyperlipidemia, unspecified: Secondary | ICD-10-CM | POA: Diagnosis not present

## 2018-07-31 DIAGNOSIS — E538 Deficiency of other specified B group vitamins: Secondary | ICD-10-CM | POA: Diagnosis not present

## 2018-07-31 DIAGNOSIS — J301 Allergic rhinitis due to pollen: Secondary | ICD-10-CM | POA: Diagnosis not present

## 2018-07-31 DIAGNOSIS — G4700 Insomnia, unspecified: Secondary | ICD-10-CM | POA: Diagnosis not present

## 2018-07-31 DIAGNOSIS — R1013 Epigastric pain: Secondary | ICD-10-CM | POA: Diagnosis not present

## 2018-07-31 DIAGNOSIS — E118 Type 2 diabetes mellitus with unspecified complications: Secondary | ICD-10-CM | POA: Diagnosis not present

## 2018-08-03 DIAGNOSIS — E785 Hyperlipidemia, unspecified: Secondary | ICD-10-CM | POA: Diagnosis not present

## 2018-08-03 DIAGNOSIS — E118 Type 2 diabetes mellitus with unspecified complications: Secondary | ICD-10-CM | POA: Diagnosis not present

## 2018-11-03 DIAGNOSIS — E538 Deficiency of other specified B group vitamins: Secondary | ICD-10-CM | POA: Diagnosis not present

## 2018-11-03 DIAGNOSIS — E669 Obesity, unspecified: Secondary | ICD-10-CM | POA: Diagnosis not present

## 2018-11-03 DIAGNOSIS — Z23 Encounter for immunization: Secondary | ICD-10-CM | POA: Diagnosis not present

## 2018-11-03 DIAGNOSIS — R1013 Epigastric pain: Secondary | ICD-10-CM | POA: Diagnosis not present

## 2018-11-03 DIAGNOSIS — R079 Chest pain, unspecified: Secondary | ICD-10-CM | POA: Diagnosis not present

## 2018-11-03 DIAGNOSIS — R05 Cough: Secondary | ICD-10-CM | POA: Diagnosis not present

## 2018-11-03 DIAGNOSIS — E118 Type 2 diabetes mellitus with unspecified complications: Secondary | ICD-10-CM | POA: Diagnosis not present

## 2018-11-03 DIAGNOSIS — E785 Hyperlipidemia, unspecified: Secondary | ICD-10-CM | POA: Diagnosis not present

## 2018-11-03 DIAGNOSIS — E559 Vitamin D deficiency, unspecified: Secondary | ICD-10-CM | POA: Diagnosis not present

## 2018-11-05 DIAGNOSIS — R05 Cough: Secondary | ICD-10-CM | POA: Diagnosis not present

## 2018-11-05 DIAGNOSIS — R079 Chest pain, unspecified: Secondary | ICD-10-CM | POA: Diagnosis not present

## 2019-05-12 DIAGNOSIS — Z9181 History of falling: Secondary | ICD-10-CM | POA: Diagnosis not present

## 2019-05-12 DIAGNOSIS — E538 Deficiency of other specified B group vitamins: Secondary | ICD-10-CM | POA: Diagnosis not present

## 2019-05-12 DIAGNOSIS — G4700 Insomnia, unspecified: Secondary | ICD-10-CM | POA: Diagnosis not present

## 2019-05-12 DIAGNOSIS — Z79899 Other long term (current) drug therapy: Secondary | ICD-10-CM | POA: Diagnosis not present

## 2019-05-12 DIAGNOSIS — Z1331 Encounter for screening for depression: Secondary | ICD-10-CM | POA: Diagnosis not present

## 2019-05-12 DIAGNOSIS — E785 Hyperlipidemia, unspecified: Secondary | ICD-10-CM | POA: Diagnosis not present

## 2019-05-12 DIAGNOSIS — Z139 Encounter for screening, unspecified: Secondary | ICD-10-CM | POA: Diagnosis not present

## 2019-05-12 DIAGNOSIS — E118 Type 2 diabetes mellitus with unspecified complications: Secondary | ICD-10-CM | POA: Diagnosis not present

## 2019-05-12 DIAGNOSIS — E559 Vitamin D deficiency, unspecified: Secondary | ICD-10-CM | POA: Diagnosis not present

## 2019-08-13 DIAGNOSIS — E785 Hyperlipidemia, unspecified: Secondary | ICD-10-CM | POA: Diagnosis not present

## 2019-08-13 DIAGNOSIS — R413 Other amnesia: Secondary | ICD-10-CM | POA: Diagnosis not present

## 2019-08-13 DIAGNOSIS — E118 Type 2 diabetes mellitus with unspecified complications: Secondary | ICD-10-CM | POA: Diagnosis not present

## 2019-08-13 DIAGNOSIS — M1990 Unspecified osteoarthritis, unspecified site: Secondary | ICD-10-CM | POA: Diagnosis not present

## 2019-08-13 DIAGNOSIS — E538 Deficiency of other specified B group vitamins: Secondary | ICD-10-CM | POA: Diagnosis not present

## 2019-08-13 DIAGNOSIS — E559 Vitamin D deficiency, unspecified: Secondary | ICD-10-CM | POA: Diagnosis not present

## 2019-08-13 DIAGNOSIS — G4700 Insomnia, unspecified: Secondary | ICD-10-CM | POA: Diagnosis not present

## 2019-08-13 DIAGNOSIS — R42 Dizziness and giddiness: Secondary | ICD-10-CM | POA: Diagnosis not present

## 2019-08-13 DIAGNOSIS — Z6832 Body mass index (BMI) 32.0-32.9, adult: Secondary | ICD-10-CM | POA: Diagnosis not present

## 2019-09-14 DIAGNOSIS — Z6832 Body mass index (BMI) 32.0-32.9, adult: Secondary | ICD-10-CM | POA: Diagnosis not present

## 2019-09-14 DIAGNOSIS — K5900 Constipation, unspecified: Secondary | ICD-10-CM | POA: Diagnosis not present

## 2019-09-14 DIAGNOSIS — R195 Other fecal abnormalities: Secondary | ICD-10-CM | POA: Diagnosis not present

## 2019-09-14 DIAGNOSIS — Z1212 Encounter for screening for malignant neoplasm of rectum: Secondary | ICD-10-CM | POA: Diagnosis not present

## 2019-11-16 DIAGNOSIS — E559 Vitamin D deficiency, unspecified: Secondary | ICD-10-CM | POA: Diagnosis not present

## 2019-11-16 DIAGNOSIS — Z6832 Body mass index (BMI) 32.0-32.9, adult: Secondary | ICD-10-CM | POA: Diagnosis not present

## 2019-11-16 DIAGNOSIS — Z23 Encounter for immunization: Secondary | ICD-10-CM | POA: Diagnosis not present

## 2019-11-16 DIAGNOSIS — E785 Hyperlipidemia, unspecified: Secondary | ICD-10-CM | POA: Diagnosis not present

## 2019-11-16 DIAGNOSIS — G4700 Insomnia, unspecified: Secondary | ICD-10-CM | POA: Diagnosis not present

## 2019-11-16 DIAGNOSIS — M1990 Unspecified osteoarthritis, unspecified site: Secondary | ICD-10-CM | POA: Diagnosis not present

## 2019-11-16 DIAGNOSIS — R42 Dizziness and giddiness: Secondary | ICD-10-CM | POA: Diagnosis not present

## 2019-11-16 DIAGNOSIS — E118 Type 2 diabetes mellitus with unspecified complications: Secondary | ICD-10-CM | POA: Diagnosis not present

## 2019-11-16 DIAGNOSIS — J9801 Acute bronchospasm: Secondary | ICD-10-CM | POA: Diagnosis not present

## 2020-02-17 DIAGNOSIS — M1990 Unspecified osteoarthritis, unspecified site: Secondary | ICD-10-CM | POA: Diagnosis not present

## 2020-02-17 DIAGNOSIS — E559 Vitamin D deficiency, unspecified: Secondary | ICD-10-CM | POA: Diagnosis not present

## 2020-02-17 DIAGNOSIS — Z6832 Body mass index (BMI) 32.0-32.9, adult: Secondary | ICD-10-CM | POA: Diagnosis not present

## 2020-02-17 DIAGNOSIS — R42 Dizziness and giddiness: Secondary | ICD-10-CM | POA: Diagnosis not present

## 2020-02-17 DIAGNOSIS — E785 Hyperlipidemia, unspecified: Secondary | ICD-10-CM | POA: Diagnosis not present

## 2020-02-17 DIAGNOSIS — E118 Type 2 diabetes mellitus with unspecified complications: Secondary | ICD-10-CM | POA: Diagnosis not present

## 2020-02-17 DIAGNOSIS — E538 Deficiency of other specified B group vitamins: Secondary | ICD-10-CM | POA: Diagnosis not present

## 2020-02-17 DIAGNOSIS — G4700 Insomnia, unspecified: Secondary | ICD-10-CM | POA: Diagnosis not present

## 2020-02-17 DIAGNOSIS — J9801 Acute bronchospasm: Secondary | ICD-10-CM | POA: Diagnosis not present

## 2020-05-19 DIAGNOSIS — Z6832 Body mass index (BMI) 32.0-32.9, adult: Secondary | ICD-10-CM | POA: Diagnosis not present

## 2020-05-19 DIAGNOSIS — E785 Hyperlipidemia, unspecified: Secondary | ICD-10-CM | POA: Diagnosis not present

## 2020-05-19 DIAGNOSIS — E118 Type 2 diabetes mellitus with unspecified complications: Secondary | ICD-10-CM | POA: Diagnosis not present

## 2020-05-19 DIAGNOSIS — R42 Dizziness and giddiness: Secondary | ICD-10-CM | POA: Diagnosis not present

## 2020-05-19 DIAGNOSIS — Z139 Encounter for screening, unspecified: Secondary | ICD-10-CM | POA: Diagnosis not present

## 2020-05-19 DIAGNOSIS — G4700 Insomnia, unspecified: Secondary | ICD-10-CM | POA: Diagnosis not present

## 2020-05-19 DIAGNOSIS — M545 Low back pain, unspecified: Secondary | ICD-10-CM | POA: Diagnosis not present

## 2020-05-19 DIAGNOSIS — Z79899 Other long term (current) drug therapy: Secondary | ICD-10-CM | POA: Diagnosis not present

## 2020-05-19 DIAGNOSIS — E559 Vitamin D deficiency, unspecified: Secondary | ICD-10-CM | POA: Diagnosis not present

## 2020-05-19 DIAGNOSIS — R413 Other amnesia: Secondary | ICD-10-CM | POA: Diagnosis not present

## 2020-05-19 DIAGNOSIS — Z9181 History of falling: Secondary | ICD-10-CM | POA: Diagnosis not present

## 2020-05-19 DIAGNOSIS — Z1331 Encounter for screening for depression: Secondary | ICD-10-CM | POA: Diagnosis not present

## 2020-06-23 DIAGNOSIS — G4700 Insomnia, unspecified: Secondary | ICD-10-CM | POA: Diagnosis not present

## 2020-06-23 DIAGNOSIS — H6092 Unspecified otitis externa, left ear: Secondary | ICD-10-CM | POA: Diagnosis not present

## 2020-06-23 DIAGNOSIS — H9190 Unspecified hearing loss, unspecified ear: Secondary | ICD-10-CM | POA: Diagnosis not present

## 2020-06-23 DIAGNOSIS — Z6830 Body mass index (BMI) 30.0-30.9, adult: Secondary | ICD-10-CM | POA: Diagnosis not present

## 2020-08-28 DIAGNOSIS — Z6829 Body mass index (BMI) 29.0-29.9, adult: Secondary | ICD-10-CM | POA: Diagnosis not present

## 2020-08-28 DIAGNOSIS — R413 Other amnesia: Secondary | ICD-10-CM | POA: Diagnosis not present

## 2020-08-28 DIAGNOSIS — E118 Type 2 diabetes mellitus with unspecified complications: Secondary | ICD-10-CM | POA: Diagnosis not present

## 2020-08-28 DIAGNOSIS — E785 Hyperlipidemia, unspecified: Secondary | ICD-10-CM | POA: Diagnosis not present

## 2020-08-28 DIAGNOSIS — G4700 Insomnia, unspecified: Secondary | ICD-10-CM | POA: Diagnosis not present

## 2020-08-28 DIAGNOSIS — R42 Dizziness and giddiness: Secondary | ICD-10-CM | POA: Diagnosis not present

## 2020-08-28 DIAGNOSIS — E559 Vitamin D deficiency, unspecified: Secondary | ICD-10-CM | POA: Diagnosis not present

## 2020-08-28 DIAGNOSIS — E538 Deficiency of other specified B group vitamins: Secondary | ICD-10-CM | POA: Diagnosis not present

## 2021-01-01 ENCOUNTER — Encounter: Payer: Self-pay | Admitting: *Deleted

## 2021-01-01 ENCOUNTER — Encounter: Payer: Self-pay | Admitting: Cardiology

## 2021-01-01 DIAGNOSIS — E119 Type 2 diabetes mellitus without complications: Secondary | ICD-10-CM | POA: Insufficient documentation

## 2021-01-01 DIAGNOSIS — E785 Hyperlipidemia, unspecified: Secondary | ICD-10-CM | POA: Insufficient documentation

## 2021-01-01 DIAGNOSIS — M1990 Unspecified osteoarthritis, unspecified site: Secondary | ICD-10-CM | POA: Insufficient documentation

## 2021-01-01 DIAGNOSIS — R413 Other amnesia: Secondary | ICD-10-CM | POA: Insufficient documentation

## 2021-01-23 ENCOUNTER — Ambulatory Visit: Payer: Medicare PPO | Admitting: Cardiology

## 2021-03-08 DIAGNOSIS — E538 Deficiency of other specified B group vitamins: Secondary | ICD-10-CM | POA: Diagnosis not present

## 2021-03-08 DIAGNOSIS — E785 Hyperlipidemia, unspecified: Secondary | ICD-10-CM | POA: Diagnosis not present

## 2021-03-08 DIAGNOSIS — E559 Vitamin D deficiency, unspecified: Secondary | ICD-10-CM | POA: Diagnosis not present

## 2021-03-08 DIAGNOSIS — Z6828 Body mass index (BMI) 28.0-28.9, adult: Secondary | ICD-10-CM | POA: Diagnosis not present

## 2021-03-08 DIAGNOSIS — E1169 Type 2 diabetes mellitus with other specified complication: Secondary | ICD-10-CM | POA: Diagnosis not present

## 2021-03-08 DIAGNOSIS — I3139 Other pericardial effusion (noninflammatory): Secondary | ICD-10-CM | POA: Diagnosis not present

## 2021-03-08 DIAGNOSIS — G4700 Insomnia, unspecified: Secondary | ICD-10-CM | POA: Diagnosis not present

## 2021-03-08 DIAGNOSIS — Z79899 Other long term (current) drug therapy: Secondary | ICD-10-CM | POA: Diagnosis not present

## 2021-03-08 DIAGNOSIS — R413 Other amnesia: Secondary | ICD-10-CM | POA: Diagnosis not present

## 2021-03-09 ENCOUNTER — Other Ambulatory Visit: Payer: Self-pay

## 2021-03-12 ENCOUNTER — Other Ambulatory Visit: Payer: Self-pay

## 2021-03-12 ENCOUNTER — Other Ambulatory Visit: Payer: Medicare PPO

## 2021-03-12 ENCOUNTER — Ambulatory Visit: Payer: Medicare PPO | Admitting: Cardiology

## 2021-03-12 VITALS — BP 134/50 | HR 60 | Ht 68.0 in | Wt 205.8 lb

## 2021-03-12 DIAGNOSIS — E785 Hyperlipidemia, unspecified: Secondary | ICD-10-CM

## 2021-03-12 DIAGNOSIS — I3139 Other pericardial effusion (noninflammatory): Secondary | ICD-10-CM

## 2021-03-12 DIAGNOSIS — R0609 Other forms of dyspnea: Secondary | ICD-10-CM | POA: Diagnosis not present

## 2021-03-12 DIAGNOSIS — R072 Precordial pain: Secondary | ICD-10-CM | POA: Diagnosis not present

## 2021-03-12 DIAGNOSIS — R079 Chest pain, unspecified: Secondary | ICD-10-CM | POA: Diagnosis not present

## 2021-03-12 MED ORDER — ASPIRIN EC 81 MG PO TBEC: 81.0000 mg | DELAYED_RELEASE_TABLET | Freq: Every day | ORAL | 3 refills | Status: AC

## 2021-03-12 MED ORDER — METOPROLOL TARTRATE 50 MG PO TABS: ORAL_TABLET | ORAL | 0 refills | Status: DC

## 2021-03-12 NOTE — Patient Instructions (Addendum)
Medication Instructions:  Your physician has recommended you make the following change in your medication:   START: Aspirin 81 mg daily  *If you need a refill on your cardiac medications before your next appointment, please call your pharmacy*   Lab Work: BMP 1 week before CT If you have labs (blood work) drawn today and your tests are completely normal, you will receive your results only by: [place] (if you have MyChart) OR A paper copy in the mail If you have any lab test that is abnormal or we need to change your treatment, we will call you to review the results.   Testing/Procedures: Your physician has requested that you have an echocardiogram. Echocardiography is a painless test that uses sound waves to create images of your heart. It provides your doctor with information about the size and shape of your heart and how well your hearts chambers and valves are working. This procedure takes approximately one hour. There are no restrictions for this procedure.      Your cardiac CT will be scheduled at one of the below locations:   [facility] [address] [place], [place] [address] [phone]  [place] [address] [place], [place] [address] [phone]  If scheduled at [facility], please arrive at the [facility] main entrance (entrance A) of [facility] 30 minutes prior to test start time. You can use the FREE valet parking offered at the main entrance (encouraged to control the heart rate for the test) Proceed to the [facility] Radiology Department (first floor) to check-in and test prep.  If scheduled at [facility], please arrive 15 mins early for check-in and test prep.  Please follow these instructions carefully (unless otherwise directed):  Hold all erectile dysfunction medications at least 3 days (72 hrs) prior to test.  On the Night  Before the Test: Be sure to Drink plenty of water. Do not consume any caffeinated/decaffeinated beverages or chocolate 12 hours prior to your test. Do not take any antihistamines 12 hours prior to your test.  On the Day of the Test: Drink plenty of water until 1 hour prior to the test. Do not eat any food 4 hours prior to the test. You may take your regular medications prior to the test.  Take metoprolol (Lopressor) two hours prior to test.        After the Test: Drink plenty of water. After receiving IV contrast, you may experience a mild flushed feeling. This is normal. On occasion, you may experience a mild rash up to 24 hours after the test. This is not dangerous. If this occurs, you can take Benadryl 25 mg and increase your fluid intake. If you experience trouble breathing, this can be serious. If it is severe call 911 IMMEDIATELY. If it is mild, please call our office. If you take any of these medications: Glipizide/Metformin, Avandament, Glucavance, please do not take 48 hours after completing test unless otherwise instructed.  We will call to schedule your test 2-4 weeks out understanding that some insurance companies will need an authorization prior to the service being performed.   For non-scheduling related questions, please contact the cardiac imaging nurse navigator should you have any questions/concerns: Marchia Bond, Cardiac Imaging Nurse Navigator Gordy Clement, Cardiac Imaging Nurse Navigator [facility] Direct Office Dial: [phone]   For scheduling needs, including cancellations and rescheduling, please call [place], [phone].  Follow-Up: At [facility], you and your health needs are our priority.  As part of our continuing mission to provide you with exceptional heart care, we have created designated Provider Care Teams.  These Care Teams include your primary Cardiologist (physician) and Advanced Practice Providers (APPs -   Physician Assistants and Nurse Practitioners) who all work together to provide you with the care you need, when you need it.  We recommend signing up for the patient portal called "MyChart".  Sign up information is provided on this After Visit Summary.  MyChart is used to connect with patients for Virtual Visits (Telemedicine).  Patients are able to view lab/test results, encounter notes, upcoming appointments, etc.  Non-urgent messages can be sent to your provider as well.   To learn more about what you can do with MyChart, go to [email or web address].    Your next appointment:   6 week(s)  The format for your next appointment:   In Person  Provider:   Jenne Campus, MD    Other Instructions None

## 2021-03-12 NOTE — Progress Notes (Signed)
Cardiology Consultation:    Date:  03/12/2021   ID:  Marland Kitchen, DOB 1935-11-29, MRN [id number]  PCP:  Earlyne Iba, NP  Cardiologist:  Jenne Campus, MD   Referring MD: Earlyne Iba, NP   Chief Complaint  Patient presents with   Chest Pain   Shortness of Breath    Ongoing for 1 month    History of Present Illness:    William Hamilton is a 86 y.o. male who is being seen today for the evaluation of shortness of breath, chest pain at the request of Earlyne Iba, NP.  Past medical history significant for dyslipidemia, essential hypertension, diabetes.  He is is a chronic smoker.  He is known to have pericardial effusion which was last time assessed in October 2022 when he had CT of his chest done.  That effusion was described of small however increased slightly in size compared to 2018.  He is a very poor historian and it is really very difficult to get a good story from him.  What he describes is some sensation in the right side of his chest he said he lack of air in that side.  When he walks he will get some sensation in the right side of his chest but he have really difficult time to describe it.  However he described few episodes that he had to wake up in the middle of night with severe shortness of breath.  He had to sit up to catch his breath.  He is not sure if he snores at night.  He is trying to be active but have difficulty moving around too much because of fatigue tiredness and some shortness of breath.  There is no swelling of lower extremities there is no palpitations no dizziness no passing out. He quit smoking few years ago He does not exercise on the regular basis, he is not on any diet. He does have family history of coronary artery disease but not premature. 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   ALPRAZolam (XANAX) 0.5 MG tablet Take 0.5 mg by mouth at bedtime as needed for sleep.   ergocalciferol (VITAMIN D2) 1.25 MG (50000 UT) capsule Take 50,000 Units by mouth once a week.   meclizine (ANTIVERT) 25 MG tablet Take 25 mg by mouth 3 (three) times daily as needed for dizziness.   memantine (NAMENDA) 5 MG tablet Take 5 mg by mouth as needed (sleep).   rosuvastatin (CRESTOR) 40 MG tablet Take 40 mg by mouth daily.     Allergies:   Patient has no known allergies.   Social History   Socioeconomic History   Marital status: Married    Spouse name: Not on file   Number of children: Not on  file   Years of education: Not on file   Highest education level: Not on file  Occupational History   Not on file  Tobacco Use   Smoking status: Not on file   Smokeless tobacco: Not on file  Substance and Sexual Activity   Alcohol use: Not on file   Drug use: Not on file   Sexual activity: Not on file  Other Topics Concern   Not on file  Social History Narrative   Not on file   Social Determinants of Health   Financial Resource Strain: Not on file  Food Insecurity: Not on file  Transportation Needs: Not on file  Physical Activity: Not on file  Stress: Not on file  Social Connections: Not on file     Family History: The patient's family history includes Diabetes in his brother and father; Heart attack in his brother; Heart failure in his mother. ROS:   Please see the history of present illness.     All 14 point review of systems negative except as described per history of present illness.  EKGs/Labs/Other Studies Reviewed:    The following studies were reviewed today: CT from December 04, 2020 done in the hospital showed no evidence of pulmonary emboli small pericardial effusion slight increase in size compared to study from 2018, bilateral nonobstructive renal stones measuring up to 7 mm on the right.  Aortic atherosclerosis.  EKG:  EKG is  ordered today.  The ekg ordered today demonstrates normal sinus rhythm rate 62, cannot rule out anterior wall MI, nonspecific ST segment changes  Recent Labs: No results found for requested labs within last 8760 hours.  Recent Lipid Panel No results found for: CHOL, TRIG, HDL, CHOLHDL, VLDL, LDLCALC, LDLDIRECT  Physical Exam:    VS:  BP (!) 134/50 (BP Location: Left Arm, Patient Position: Sitting)    Pulse 60    Ht 5\' 8"  (1.727 m)    Wt 205 lb 12.8 oz (93.4 kg)    SpO2 97%    BMI 31.29 kg/m     Wt Readings from Last 3 Encounters:  03/12/21 205 lb 12.8 oz (93.4 kg)  11/29/20 201 lb (91.2 kg)     GEN:  Well nourished, well developed in no acute distress HEENT: Normal NECK: No JVD; No carotid bruits LYMPHATICS: No lymphadenopathy CARDIAC: RRR, no murmurs, no rubs, no gallops, tones are distant RESPIRATORY:  Clear to auscultation without rales, wheezing or rhonchi  ABDOMEN: Soft, non-tender, non-distended MUSCULOSKELETAL:  No edema; No deformity  SKIN: Warm and dry NEUROLOGIC:  Alert and oriented x 3 PSYCHIATRIC:  Normal affect   ASSESSMENT:    1. Precordial pain   2. Pericardial effusion   3. Dyspnea on exertion   4. Dyslipidemia    PLAN:    In order of problems listed above:  Is a very poor historian he does have precordial chest pain which is very atypical or rather difficult to evaluate from his story what kind of pain he is talking about.  I did review his record from the hospital he did have a 2 stress test last time done  in 2018 both were negative.  I think we need to look at his coronary arteries and I think the best way to do it will be to perform coronary CT angio.  I did explain procedure to him and he is willing to proceed. History of pericardial effusion on the physical exam I do not see any signs and symptoms of tamponade.  He  does not have JVD even when he is lying flat.  I will schedule him to have echocardiogram to assess left ventricle ejection fraction look at the size and hemodynamic influence of pericardial effusion. [place] exertion we will evaluate him for coronary artery disease as well as for ejection fraction. Dyslipidemia he is taking Crestor right now.  I did review his laboratory test from [facility] and in October his LDL was 70 HDL 49 good cholesterol profile we will continue present management. Essential hypertension blood pressure seems to be well controlled continue present management. Diagnosis of diabetes however he does not confirm that his hemoglobin A1c is 6.0 from October of last year.   Medication Adjustments/Labs and Tests Ordered: Current medicines are reviewed at length with the patient today.  Concerns regarding medicines are outlined above.  No orders of the defined types were placed in this encounter.  No orders of the defined types were placed in this encounter.   Signed, Park Liter, MD, [facility]. 03/12/2021 9:15 AM    [facility]

## 2021-03-14 ENCOUNTER — Other Ambulatory Visit: Payer: Self-pay

## 2021-03-14 ENCOUNTER — Ambulatory Visit (INDEPENDENT_AMBULATORY_CARE_PROVIDER_SITE_OTHER): Payer: Medicare PPO

## 2021-03-14 DIAGNOSIS — R0609 Other forms of dyspnea: Secondary | ICD-10-CM

## 2021-03-14 DIAGNOSIS — I3139 Other pericardial effusion (noninflammatory): Secondary | ICD-10-CM

## 2021-03-14 DIAGNOSIS — E785 Hyperlipidemia, unspecified: Secondary | ICD-10-CM | POA: Diagnosis not present

## 2021-03-14 DIAGNOSIS — R072 Precordial pain: Secondary | ICD-10-CM

## 2021-03-14 DIAGNOSIS — R079 Chest pain, unspecified: Secondary | ICD-10-CM | POA: Diagnosis not present

## 2021-03-14 LAB — ECHOCARDIOGRAM COMPLETE
Area-P 1/2: 3 cm2
P 1/2 time: 936 msec
S' Lateral: 3.3 cm

## 2021-03-29 ENCOUNTER — Telehealth: Payer: Self-pay

## 2021-03-29 NOTE — Telephone Encounter (Signed)
-----   Message from [place] sent at 03/29/2021  1:32 PM EST ----- Regarding: Update Good Afternoon,  I just spoke to William Hamilton and he refused to schedule his cardiac ct scan. He said he is feeling good and was told his heart is okay. He would like to speak to someone before he schedule his ct scan. He do have a follow up in march and was willing to wait until then. I was trying to see if someone can reach out to him about it so we can get him in before his follow up appointment.  Thanks, [place]

## 2021-03-29 NOTE — Telephone Encounter (Cosign Needed)
Attempted to call the patient to speak to him about refusing his coronary CT. Patient did not answer. Left message for patient to call us back.

## 2021-04-05 ENCOUNTER — Telehealth: Payer: Self-pay

## 2021-04-05 NOTE — Telephone Encounter (Signed)
Called patient to explain about having the coronary CT that was ordered and patient stated he felt fine and wanted to wait until his next appointment in March.Patient had no other questions at this time.

## 2021-04-10 ENCOUNTER — Telehealth: Payer: Self-pay | Admitting: Cardiology

## 2021-04-10 NOTE — Telephone Encounter (Signed)
Pt would like to schedule his CT scan. He was confused before but believes if Dr. Raliegh Ip wants him to have it done, he will. Please call to schedule.  [phone]  [facility]

## 2021-04-11 ENCOUNTER — Telehealth: Payer: Self-pay

## 2021-04-11 NOTE — Telephone Encounter (Signed)
-----   Message from Melony Overly sent at 04/10/2021  4:40 PM EST ----- ?Regarding: ct ?Hey it is nothing else that need to be done on your end. He just need labs done and he is now scheduled doe 04/23/21 at 12:30 ? ?

## 2021-04-11 NOTE — Telephone Encounter (Signed)
Attempted to call patient to inform him of when to have his lab work drawn for his CT. Patient did not answer the phone. Left message for him to call us back. ?

## 2021-04-19 ENCOUNTER — Telehealth (HOSPITAL_COMMUNITY): Payer: Self-pay | Admitting: *Deleted

## 2021-04-19 DIAGNOSIS — R0609 Other forms of dyspnea: Secondary | ICD-10-CM | POA: Diagnosis not present

## 2021-04-19 DIAGNOSIS — R079 Chest pain, unspecified: Secondary | ICD-10-CM | POA: Diagnosis not present

## 2021-04-19 DIAGNOSIS — R072 Precordial pain: Secondary | ICD-10-CM | POA: Diagnosis not present

## 2021-04-19 DIAGNOSIS — E785 Hyperlipidemia, unspecified: Secondary | ICD-10-CM | POA: Diagnosis not present

## 2021-04-19 DIAGNOSIS — I3139 Other pericardial effusion (noninflammatory): Secondary | ICD-10-CM | POA: Diagnosis not present

## 2021-04-19 NOTE — Telephone Encounter (Signed)
Reaching out to patient to offer assistance regarding upcoming cardiac imaging study; pt verbalizes understanding of appt date/time and will obtain labs today. He is unsure of other instructions. Informed him I would reach out to Dr. Wendy Poet office to make them aware he was coming for labs and review instructions with him while he was there.   ? ?Gordy Clement RN Navigator Cardiac Imaging ? Heart [facility] ?[phone] office ?[phone] cell ? ?

## 2021-04-20 ENCOUNTER — Telehealth (HOSPITAL_COMMUNITY): Payer: Self-pay | Admitting: *Deleted

## 2021-04-20 NOTE — Telephone Encounter (Signed)
Reaching out to patient to offer assistance regarding upcoming cardiac imaging study; pt verbalizes understanding of appt date/time, parking situation and where to check in, pre-test NPO status and medications ordered, and verified current allergies; name and call back number provided for further questions should they arise ? ?Gordy Clement RN Navigator Cardiac Imaging ?[facility] ?[phone] office ?[phone] cell ? ?Patient's son was with patient and reviewed instructions with him as well.  Patient is to take '50mg'$  metoprolol tartrate two hours prior to his cardiac CT scan.  They are aware the patient is to arrive at 12:30pm for his 1pm scan. ?

## 2021-04-20 NOTE — Telephone Encounter (Signed)
Attempted to call patient regarding upcoming cardiac CT appointment. °Left message on voicemail with name and callback number ° °Oliviana Mcgahee RN Navigator Cardiac Imaging ° [facility] °[phone] Office °[phone] Cell ° °

## 2021-04-21 LAB — BASIC METABOLIC PANEL
BUN/Creatinine Ratio: 13 (ref 10–24)
BUN: 15 mg/dL (ref 8–27)
CO2: 24 mmol/L (ref 20–29)
Calcium: 9.7 mg/dL (ref 8.6–10.2)
Chloride: 103 mmol/L (ref 96–106)
Creatinine, Ser: 1.16 mg/dL (ref 0.76–1.27)
Glucose: 92 mg/dL (ref 70–99)
Potassium: 5 mmol/L (ref 3.5–5.2)
Sodium: 143 mmol/L (ref 134–144)
eGFR: 62 mL/min/{1.73_m2} (ref >59)

## 2021-04-23 ENCOUNTER — Ambulatory Visit (HOSPITAL_COMMUNITY)
Admission: RE | Admit: 2021-04-23 | Discharge: 2021-04-23 | Disposition: A | Discharge status: Home / Self Care | Payer: Medicare PPO | Source: Ambulatory Visit | Attending: Cardiology | Admitting: Cardiology

## 2021-04-23 ENCOUNTER — Other Ambulatory Visit: Payer: Self-pay

## 2021-04-23 DIAGNOSIS — R079 Chest pain, unspecified: Secondary | ICD-10-CM | POA: Insufficient documentation

## 2021-04-23 DIAGNOSIS — I2584 Coronary atherosclerosis due to calcified coronary lesion: Secondary | ICD-10-CM | POA: Diagnosis not present

## 2021-04-23 DIAGNOSIS — I3139 Other pericardial effusion (noninflammatory): Secondary | ICD-10-CM | POA: Diagnosis not present

## 2021-04-23 MED ORDER — NITROGLYCERIN 0.4 MG SL SUBL
0.8000 mg | SUBLINGUAL_TABLET | Freq: Once | SUBLINGUAL | Status: AC
  Administered 2021-04-23: 0.8 mg via SUBLINGUAL

## 2021-04-23 MED ORDER — IOHEXOL 350 MG/ML SOLN
100.0000 mL | Freq: Once | INTRAVENOUS | Status: AC | PRN
  Administered 2021-04-23: 100 mL via INTRAVENOUS

## 2021-04-23 MED ORDER — NITROGLYCERIN 0.4 MG SL SUBL
SUBLINGUAL_TABLET | SUBLINGUAL | Status: AC
  Filled 2021-04-23: qty 2

## 2021-05-02 ENCOUNTER — Encounter: Payer: Self-pay | Admitting: Cardiology

## 2021-05-02 ENCOUNTER — Ambulatory Visit: Payer: Medicare PPO | Admitting: Cardiology

## 2021-05-02 VITALS — BP 148/72 | HR 57 | Ht 69.0 in | Wt 205.8 lb

## 2021-05-02 DIAGNOSIS — R0609 Other forms of dyspnea: Secondary | ICD-10-CM

## 2021-05-02 DIAGNOSIS — R072 Precordial pain: Secondary | ICD-10-CM

## 2021-05-02 DIAGNOSIS — I3139 Other pericardial effusion (noninflammatory): Secondary | ICD-10-CM

## 2021-05-02 DIAGNOSIS — I447 Left bundle-branch block, unspecified: Secondary | ICD-10-CM

## 2021-05-02 DIAGNOSIS — E782 Mixed hyperlipidemia: Secondary | ICD-10-CM | POA: Diagnosis not present

## 2021-05-02 NOTE — Patient Instructions (Signed)

## 2021-05-02 NOTE — Progress Notes (Signed)
?Cardiology Office Note:   ? ?Date:  05/02/2021  ? ?ID:  Marland Kitchen, DOB 1935/07/19, MRN [id number] ? ?PCP:  Earlyne Iba, NP  ?Cardiologist:  Jenne Campus, MD   ? ?Referring MD: Earlyne Iba, NP  ? ?Chief Complaint  ?Patient presents with  ? Results  ?  CT  ? ? ?History of Present Illness:   ? ?William Hamilton is a 86 y.o. male who was referred to [place] originally because of multiple risk factors for coronary artery disease and dyspnea on exertion.  He quit smoking years ago, he does have dyslipidemia, essential hypertension, also he did have COVID-19 infection not that long ago since that time he has been having difficulty seeing as well as shortness of breath also described to have some pain in the right upper portion of his chest.  Cardiac evaluation included coronary CT angio which showed only mild calcified plaque LAD proximal RCA however no nonflow limiting between 0 and 24%.  He was find to have also small or syncope or show pericardial effusion without hemodynamically compromise ejection.  His echocardiogram showed preserved left ventricle ejection fraction.  No significant valvular pathology.  Look like I have no cardiac explanation for his dyspnea on exertion and fatigue.  Overall he seems to be doing well he complained of having memory problems he is coming to my office with his son. ? ?Past Medical History:  ?Diagnosis Date  ? Arthritis   ? B12 deficiency   ? Diabetes type 2, controlled ([place])   ? Dyslipidemia 12/12/2014  ? GERD (gastroesophageal reflux disease)   ? Hyperlipidemia   ? Insomnia   ? Memory impairment   ? Opioid use agreement exists   ? Precordial pain 12/12/2014  ? 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 Vitamin D deficiency   ? ? ?Past Surgical History:  ?Procedure Laterality Date  ? APPENDECTOMY  1956  ? CHOLECYSTECTOMY  2008  ? ? ?Current Medications: ?Current Meds  ?Medication Sig  ? albuterol (VENTOLIN HFA) 108 (90 Base) MCG/ACT inhaler Inhale 2 puffs into the lungs every  6 (six) hours as needed for wheezing or shortness of breath.  ? ALPRAZolam (XANAX) 0.5 MG tablet Take 0.5 mg by mouth at bedtime as needed for sleep.  ? aspirin EC 81 MG tablet Take 1 tablet (81 mg total) by mouth daily. Swallow whole.  ? meclizine (ANTIVERT) 25 MG tablet Take 25 mg by mouth 3 (three) times daily as needed for dizziness.  ? memantine (NAMENDA) 5 MG tablet Take 5 mg by mouth 2 (two) times daily.  ? rosuvastatin (CRESTOR) 40 MG tablet Take 40 mg by mouth daily.  ? [DISCONTINUED] ergocalciferol (VITAMIN D2) 1.25 MG (50000 UT) capsule Take 50,000 Units by mouth once a week.  ?  ? ?Allergies:   Patient has no known allergies.  ? ?Social History  ? ?Socioeconomic History  ? Marital status: Married  ?  Spouse name: Not on file  ? Number of children: Not on file  ? Years of education: Not on file  ? Highest education level: Not on file  ?Occupational History  ? Not on file  ?Tobacco Use  ? Smoking status: Former  ?  Years: 34.00  ?  Types: Cigarettes  ? Smokeless tobacco: Never  ?Vaping Use  ? Vaping Use: Never used  ?Substance and Sexual Activity  ? Alcohol use: Yes  ?  Comment: Socially  ? Drug use: Never  ? Sexual activity: Not Currently  ?Other Topics Concern  ?  Not on file  ?Social History Narrative  ? Not on file  ? ?Social Determinants of Health  ? ?Financial Resource Strain: Not on file  ?Food Insecurity: Not on file  ?Transportation Needs: Not on file  ?Physical Activity: Not on file  ?Stress: Not on file  ?Social Connections: Not on file  ?  ? ?Family History: ?The patient's family history includes Diabetes in his brother and father; Heart attack in his brother; Heart failure in his mother. ?ROS:   ?Please see the history of present illness.    ?All 14 point review of systems negative except as described per history of present illness ? ?EKGs/Labs/Other Studies Reviewed:   ? ? ? ?Recent Labs: ?04/19/2021: BUN 15; Creatinine, Ser 1.16; Potassium 5.0; Sodium 143  ?Recent Lipid Panel ?No results found  for: CHOL, TRIG, HDL, CHOLHDL, VLDL, LDLCALC, LDLDIRECT ? ?Physical Exam:   ? ?VS:  BP (!) 148/72 (BP Location: Left Arm, Patient Position: Sitting)   Pulse (!) 57   Ht '5\' 9"'$  (1.753 m)   Wt 205 lb 12.8 oz (93.4 kg)   SpO2 98%   BMI 30.39 kg/m?    ? ?Wt Readings from Last 3 Encounters:  ?05/02/21 205 lb 12.8 oz (93.4 kg)  ?03/12/21 205 lb 12.8 oz (93.4 kg)  ?11/29/20 201 lb (91.2 kg)  ?  ? ?GEN:  Well nourished, well developed in no acute distress ?HEENT: Normal ?NECK: No JVD; No carotid bruits ?LYMPHATICS: No lymphadenopathy ?CARDIAC: RRR, no murmurs, no rubs, no gallops ?RESPIRATORY:  Clear to auscultation without rales, wheezing or rhonchi  ?ABDOMEN: Soft, non-tender, non-distended ?MUSCULOSKELETAL:  No edema; No deformity  ?SKIN: Warm and dry ?LOWER EXTREMITIES: no swelling ?NEUROLOGIC:  Alert and oriented x 3 ?PSYCHIATRIC:  Normal affect  ? ?ASSESSMENT:   ? ?1. Left bundle branch block   ?2. Pericardial effusion   ?3. Mixed hyperlipidemia   ?4. Dyspnea on exertion   ?5. Precordial pain   ? ?PLAN:   ? ?In order of problems listed above: ? ?Left bundle branch of which is noticed, chronic, ejection fraction preserved overall seems to be doing well from that point review.  No significant coronary artery disease. ?Pericardial effusion only small to trace pericardial fat to hemodynamically not significant.  Stable as compared to prior test. ?Mixed dyslipidemia I did review [facility] which show me his LDL 50 HDL 61 he is on Crestor which I will continue. ?Dyspnea on exertion does not look like he does have a cardiac etiology of this phenomenon.  He will be referred to pulmonary he will require most likely pulmonary function test. ?Precordial pain located in the right side but coronary CT angio showed only nonobstructive disease. ? ? ?Medication Adjustments/Labs and Tests Ordered: ?Current medicines are reviewed at length with the patient today.  Concerns regarding medicines are outlined above.  ?No orders of the  defined types were placed in this encounter. ? ?Medication changes: No orders of the defined types were placed in this encounter. ? ? ?Signed, ?Park Liter, MD, [facility] ?05/02/2021 10:27 AM    ?[place] ?

## 2021-05-02 NOTE — Addendum Note (Signed)
Addended by: Jerl Santos R on: 05/02/2021 10:34 AM ? ? Modules accepted: Orders ? ?

## 2021-05-03 ENCOUNTER — Telehealth: Payer: Self-pay

## 2021-05-03 NOTE — Telephone Encounter (Signed)
-----   Message from Park Liter, MD sent at 04/24/2021  8:55 AM EDT ----- ?Mild coronary artery disease, medications no need to intervene mechanically ?

## 2021-05-03 NOTE — Telephone Encounter (Signed)
LM to return my call and as a last attempt, I mailed a letter requesting a call back ?

## 2021-06-12 DIAGNOSIS — R413 Other amnesia: Secondary | ICD-10-CM | POA: Diagnosis not present

## 2021-06-12 DIAGNOSIS — Z9181 History of falling: Secondary | ICD-10-CM | POA: Diagnosis not present

## 2021-06-12 DIAGNOSIS — E559 Vitamin D deficiency, unspecified: Secondary | ICD-10-CM | POA: Diagnosis not present

## 2021-06-12 DIAGNOSIS — E1169 Type 2 diabetes mellitus with other specified complication: Secondary | ICD-10-CM | POA: Diagnosis not present

## 2021-06-12 DIAGNOSIS — Z79899 Other long term (current) drug therapy: Secondary | ICD-10-CM | POA: Diagnosis not present

## 2021-06-12 DIAGNOSIS — E785 Hyperlipidemia, unspecified: Secondary | ICD-10-CM | POA: Diagnosis not present

## 2021-06-12 DIAGNOSIS — Z139 Encounter for screening, unspecified: Secondary | ICD-10-CM | POA: Diagnosis not present

## 2021-06-12 DIAGNOSIS — Z1331 Encounter for screening for depression: Secondary | ICD-10-CM | POA: Diagnosis not present

## 2021-06-12 DIAGNOSIS — M545 Low back pain, unspecified: Secondary | ICD-10-CM | POA: Diagnosis not present

## 2021-09-13 DIAGNOSIS — Z125 Encounter for screening for malignant neoplasm of prostate: Secondary | ICD-10-CM | POA: Diagnosis not present

## 2021-09-13 DIAGNOSIS — Z6828 Body mass index (BMI) 28.0-28.9, adult: Secondary | ICD-10-CM | POA: Diagnosis not present

## 2021-09-13 DIAGNOSIS — G4700 Insomnia, unspecified: Secondary | ICD-10-CM | POA: Diagnosis not present

## 2021-09-13 DIAGNOSIS — R42 Dizziness and giddiness: Secondary | ICD-10-CM | POA: Diagnosis not present

## 2021-09-13 DIAGNOSIS — E1169 Type 2 diabetes mellitus with other specified complication: Secondary | ICD-10-CM | POA: Diagnosis not present

## 2021-09-13 DIAGNOSIS — Z79899 Other long term (current) drug therapy: Secondary | ICD-10-CM | POA: Diagnosis not present

## 2021-09-13 DIAGNOSIS — R413 Other amnesia: Secondary | ICD-10-CM | POA: Diagnosis not present

## 2021-09-13 DIAGNOSIS — E785 Hyperlipidemia, unspecified: Secondary | ICD-10-CM | POA: Diagnosis not present

## 2021-11-02 ENCOUNTER — Ambulatory Visit: Payer: Medicare PPO | Attending: Cardiology | Admitting: Cardiology

## 2021-11-02 ENCOUNTER — Encounter: Payer: Self-pay | Admitting: Cardiology

## 2021-11-02 ENCOUNTER — Ambulatory Visit: Payer: Medicare PPO | Admitting: Cardiology

## 2021-11-02 VITALS — BP 126/78 | HR 56 | Ht 69.0 in | Wt 200.6 lb

## 2021-11-02 DIAGNOSIS — I2510 Atherosclerotic heart disease of native coronary artery without angina pectoris: Secondary | ICD-10-CM | POA: Insufficient documentation

## 2021-11-02 DIAGNOSIS — I3139 Other pericardial effusion (noninflammatory): Secondary | ICD-10-CM | POA: Diagnosis not present

## 2021-11-02 DIAGNOSIS — I447 Left bundle-branch block, unspecified: Secondary | ICD-10-CM | POA: Diagnosis not present

## 2021-11-02 NOTE — Addendum Note (Signed)
Addended by: Darrel Reach on: 11/02/2021 12:12 PM   Modules accepted: Orders

## 2021-11-02 NOTE — Patient Instructions (Signed)
Medication Instructions:  Your physician recommends that you continue on your current medications as directed. Please refer to the Current Medication list given to you today.  *If you need a refill on your cardiac medications before your next appointment, please call your pharmacy*   Lab Work: None If you have labs (blood work) drawn today and your tests are completely normal, you will receive your results only by: [place] (if you have MyChart) OR A paper copy in the mail If you have any lab test that is abnormal or we need to change your treatment, we will call you to review the results.   Testing/Procedures: Your physician has requested that you have an echocardiogram in February 2024. Echocardiography is a painless test that uses sound waves to create images of your heart. It provides your doctor with information about the size and shape of your heart and how well your heart's chambers and valves are working. This procedure takes approximately one hour. There are no restrictions for this procedure.    Follow-Up: At [facility], you and your health needs are our priority.  As part of our continuing mission to provide you with exceptional heart care, we have created designated Provider Care Teams.  These Care Teams include your primary Cardiologist (physician) and Advanced Practice Providers (APPs -  Physician Assistants and Nurse Practitioners) who all work together to provide you with the care you need, when you need it.  We recommend signing up for the patient portal called "MyChart".  Sign up information is provided on this After Visit Summary.  MyChart is used to connect with patients for Virtual Visits (Telemedicine).  Patients are able to view lab/test results, encounter notes, upcoming appointments, etc.  Non-urgent messages can be sent to your provider as well.   To learn more about what you can do with MyChart, go to [email or web address].    Your next  appointment:   1 year(s)  The format for your next appointment:   In Person  Provider:   Jenne Campus, MD    Other Instructions   Important Information About [place]

## 2021-11-02 NOTE — Progress Notes (Signed)
Cardiology Office Note:    Date:  11/02/2021   ID:  William Hamilton, DOB February 28, 1935, MRN [id number]  PCP:  Earlyne Iba, NP  Cardiologist:  Jenne Campus, MD    Referring MD: Earlyne Iba, NP   Chief Complaint  Patient presents with   Follow-up  Doing well  History of Present Illness:    William Hamilton is a 86 y.o. male who was referred to [place] after he suffered from COVID-19 infection he was tired exhausted short of breath cardiac work-up has been essentially negative.  Echocardiogram showed preserved left ventricle ejection fraction there was small amount of pericardial fusion, coronary CT angio has been performed which showed only minimal disease involving proximal LAD with 0 to 25% stenosis.  He was identified to have left bundle branch block.  After that he supposed to see pulmonary however and it did not happen but he overall started getting better.  He comes today to my office, overall feeling well denies have any chest pain tightness squeezing pressure burning chest he said he is back to himself.  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   aspirin EC 81 MG tablet Take 1 tablet (81 mg total) by mouth daily. Swallow whole.   memantine (NAMENDA) 5 MG tablet Take 5 mg by mouth 2 (two) times daily.   rosuvastatin (CRESTOR) 40 MG tablet Take 40 mg by mouth daily.   [DISCONTINUED] albuterol (VENTOLIN HFA) 108 (90 Base)  MCG/ACT inhaler Inhale 2 puffs into the lungs every 6 (six) hours as needed for wheezing or shortness of breath.   [DISCONTINUED] meclizine (ANTIVERT) 25 MG tablet Take 25 mg by mouth 3 (three) times daily as needed for dizziness.     Allergies:   Patient has no known allergies.   Social History   Socioeconomic History   Marital status: Married    Spouse name: Not on file   Number of children: Not on file   Years of education: Not on file   Highest education level: Not on file  Occupational History   Not on file  Tobacco Use   Smoking status: Former    Years: 34.00    Types: Cigarettes   Smokeless tobacco: Never  Vaping Use   Vaping Use: Never used  Substance and Sexual Activity   Alcohol use: Yes    Comment: Socially   Drug use: Never   Sexual activity: Not Currently  Other Topics Concern   Not on file  Social History Narrative   Not on file   Social Determinants of Health   Financial Resource Strain: Not on file  Food Insecurity: Not on file  Transportation Needs: Not on file  Physical Activity: Not on file  Stress: Not on file  Social Connections: Not on file     Family History: The patient's family history includes Diabetes in  his brother and father; Heart attack in his brother; Heart failure in his mother. ROS:   Please see the history of present illness.    All 14 point review of systems negative except as described per history of present illness  EKGs/Labs/Other Studies Reviewed:      Recent Labs: 04/19/2021: BUN 15; Creatinine, Ser 1.16; Potassium 5.0; Sodium 143  Recent Lipid Panel No results found for: "CHOL", "TRIG", "HDL", "CHOLHDL", "VLDL", "LDLCALC", "LDLDIRECT"  Physical Exam:    VS:  BP 126/78 (BP Location: Left Arm, Patient Position: Sitting)   Pulse (!) 56   Ht '5\' 9"'$  (1.753 m)   Wt 200 lb 9.6 oz (91 kg)   SpO2 96%   BMI 29.62 kg/m     Wt Readings from Last 3 Encounters:  11/02/21 200 lb 9.6 oz (91 kg)  05/02/21 205 lb 12.8 oz (93.4  kg)  03/12/21 205 lb 12.8 oz (93.4 kg)     GEN:  Well nourished, well developed in no acute distress HEENT: Normal NECK: No JVD; No carotid bruits LYMPHATICS: No lymphadenopathy CARDIAC: RRR, no murmurs, no rubs, no gallops RESPIRATORY:  Clear to auscultation without rales, wheezing or rhonchi  ABDOMEN: Soft, non-tender, non-distended MUSCULOSKELETAL:  No edema; No deformity  SKIN: Warm and dry LOWER EXTREMITIES: no swelling NEUROLOGIC:  Alert and oriented x 3 PSYCHIATRIC:  Normal affect   ASSESSMENT:    1. Left bundle branch block   2. Pericardial effusion   3. Coronary artery disease involving native coronary artery of native heart without angina pectoris    PLAN:    In order of problems listed above:  Left bundle branch block chronic noted continue monitoring. History of pericardial fusion at the beginning of next year I will do echocardiogram to check on the size of the fluid but he does not have any signs and symptoms of tamponade Coronary artery disease only minimal based on coronary CT angio.  We will continue risk factors modifications Dyslipidemia he is on Crestor 40, I see his last fasting lipid profile from September 13, 2021 with LDL 71 HDL 46 we will continue present management.   Medication Adjustments/Labs and Tests Ordered: Current medicines are reviewed at length with the patient today.  Concerns regarding medicines are outlined above.  No orders of the defined types were placed in this encounter.  Medication changes: No orders of the defined types were placed in this encounter.   Signed, Park Liter, MD, [facility] 11/02/2021 11:59 AM    [place]

## 2021-11-13 DIAGNOSIS — R229 Localized swelling, mass and lump, unspecified: Secondary | ICD-10-CM | POA: Diagnosis not present

## 2021-11-19 DIAGNOSIS — L0291 Cutaneous abscess, unspecified: Secondary | ICD-10-CM | POA: Diagnosis not present

## 2021-11-19 DIAGNOSIS — L02412 Cutaneous abscess of left axilla: Secondary | ICD-10-CM | POA: Diagnosis not present

## 2021-12-03 DIAGNOSIS — Z6829 Body mass index (BMI) 29.0-29.9, adult: Secondary | ICD-10-CM | POA: Diagnosis not present

## 2021-12-03 DIAGNOSIS — L02412 Cutaneous abscess of left axilla: Secondary | ICD-10-CM | POA: Diagnosis not present

## 2021-12-04 DIAGNOSIS — L089 Local infection of the skin and subcutaneous tissue, unspecified: Secondary | ICD-10-CM | POA: Diagnosis not present

## 2021-12-04 DIAGNOSIS — L720 Epidermal cyst: Secondary | ICD-10-CM | POA: Diagnosis not present

## 2021-12-19 DIAGNOSIS — E559 Vitamin D deficiency, unspecified: Secondary | ICD-10-CM | POA: Diagnosis not present

## 2021-12-19 DIAGNOSIS — G4700 Insomnia, unspecified: Secondary | ICD-10-CM | POA: Diagnosis not present

## 2021-12-19 DIAGNOSIS — Z23 Encounter for immunization: Secondary | ICD-10-CM | POA: Diagnosis not present

## 2021-12-19 DIAGNOSIS — L089 Local infection of the skin and subcutaneous tissue, unspecified: Secondary | ICD-10-CM | POA: Diagnosis not present

## 2021-12-19 DIAGNOSIS — R413 Other amnesia: Secondary | ICD-10-CM | POA: Diagnosis not present

## 2021-12-19 DIAGNOSIS — E785 Hyperlipidemia, unspecified: Secondary | ICD-10-CM | POA: Diagnosis not present

## 2021-12-19 DIAGNOSIS — E1169 Type 2 diabetes mellitus with other specified complication: Secondary | ICD-10-CM | POA: Diagnosis not present

## 2021-12-19 DIAGNOSIS — Z6828 Body mass index (BMI) 28.0-28.9, adult: Secondary | ICD-10-CM | POA: Diagnosis not present

## 2021-12-19 DIAGNOSIS — L720 Epidermal cyst: Secondary | ICD-10-CM | POA: Diagnosis not present

## 2021-12-19 DIAGNOSIS — E538 Deficiency of other specified B group vitamins: Secondary | ICD-10-CM | POA: Diagnosis not present

## 2022-03-14 ENCOUNTER — Ambulatory Visit: Payer: Medicare PPO | Attending: Cardiology

## 2022-03-14 DIAGNOSIS — I3139 Other pericardial effusion (noninflammatory): Secondary | ICD-10-CM

## 2022-03-14 DIAGNOSIS — I2510 Atherosclerotic heart disease of native coronary artery without angina pectoris: Secondary | ICD-10-CM | POA: Diagnosis not present

## 2022-03-14 DIAGNOSIS — I447 Left bundle-branch block, unspecified: Secondary | ICD-10-CM | POA: Diagnosis not present

## 2022-03-14 LAB — ECHOCARDIOGRAM COMPLETE
Area-P 1/2: 3.99 cm2
P 1/2 time: 1010 msec
S' Lateral: 3.9 cm

## 2022-03-26 DIAGNOSIS — G4700 Insomnia, unspecified: Secondary | ICD-10-CM | POA: Diagnosis not present

## 2022-03-26 DIAGNOSIS — E559 Vitamin D deficiency, unspecified: Secondary | ICD-10-CM | POA: Diagnosis not present

## 2022-03-26 DIAGNOSIS — Z79899 Other long term (current) drug therapy: Secondary | ICD-10-CM | POA: Diagnosis not present

## 2022-03-26 DIAGNOSIS — M545 Low back pain, unspecified: Secondary | ICD-10-CM | POA: Diagnosis not present

## 2022-03-26 DIAGNOSIS — R42 Dizziness and giddiness: Secondary | ICD-10-CM | POA: Diagnosis not present

## 2022-03-26 DIAGNOSIS — E785 Hyperlipidemia, unspecified: Secondary | ICD-10-CM | POA: Diagnosis not present

## 2022-03-26 DIAGNOSIS — E1169 Type 2 diabetes mellitus with other specified complication: Secondary | ICD-10-CM | POA: Diagnosis not present

## 2022-03-26 DIAGNOSIS — E538 Deficiency of other specified B group vitamins: Secondary | ICD-10-CM | POA: Diagnosis not present

## 2022-03-26 DIAGNOSIS — Z6829 Body mass index (BMI) 29.0-29.9, adult: Secondary | ICD-10-CM | POA: Diagnosis not present

## 2022-03-26 DIAGNOSIS — R413 Other amnesia: Secondary | ICD-10-CM | POA: Diagnosis not present

## 2022-04-04 ENCOUNTER — Telehealth: Payer: Self-pay

## 2022-04-04 NOTE — Telephone Encounter (Signed)
-----   Message from Park Liter, MD sent at 03/19/2022  5:37 PM EST ----- Echocardiogram showed preserved left ventricle ejection fraction, overall looks good

## 2022-06-27 DIAGNOSIS — E785 Hyperlipidemia, unspecified: Secondary | ICD-10-CM | POA: Diagnosis not present

## 2022-06-27 DIAGNOSIS — Z125 Encounter for screening for malignant neoplasm of prostate: Secondary | ICD-10-CM | POA: Diagnosis not present

## 2022-06-27 DIAGNOSIS — R413 Other amnesia: Secondary | ICD-10-CM | POA: Diagnosis not present

## 2022-06-27 DIAGNOSIS — E1169 Type 2 diabetes mellitus with other specified complication: Secondary | ICD-10-CM | POA: Diagnosis not present

## 2022-06-27 DIAGNOSIS — E538 Deficiency of other specified B group vitamins: Secondary | ICD-10-CM | POA: Diagnosis not present

## 2022-10-02 DIAGNOSIS — E1169 Type 2 diabetes mellitus with other specified complication: Secondary | ICD-10-CM | POA: Diagnosis not present

## 2022-10-02 DIAGNOSIS — G4700 Insomnia, unspecified: Secondary | ICD-10-CM | POA: Diagnosis not present

## 2022-10-02 DIAGNOSIS — Z139 Encounter for screening, unspecified: Secondary | ICD-10-CM | POA: Diagnosis not present

## 2022-10-02 DIAGNOSIS — K219 Gastro-esophageal reflux disease without esophagitis: Secondary | ICD-10-CM | POA: Diagnosis not present

## 2022-10-02 DIAGNOSIS — E785 Hyperlipidemia, unspecified: Secondary | ICD-10-CM | POA: Diagnosis not present

## 2022-10-02 DIAGNOSIS — Z9181 History of falling: Secondary | ICD-10-CM | POA: Diagnosis not present

## 2022-10-02 DIAGNOSIS — Z6830 Body mass index (BMI) 30.0-30.9, adult: Secondary | ICD-10-CM | POA: Diagnosis not present

## 2022-10-02 DIAGNOSIS — Z1331 Encounter for screening for depression: Secondary | ICD-10-CM | POA: Diagnosis not present

## 2022-10-02 DIAGNOSIS — E538 Deficiency of other specified B group vitamins: Secondary | ICD-10-CM | POA: Diagnosis not present

## 2022-10-11 DIAGNOSIS — H353131 Nonexudative age-related macular degeneration, bilateral, early dry stage: Secondary | ICD-10-CM | POA: Diagnosis not present

## 2022-10-11 DIAGNOSIS — Z961 Presence of intraocular lens: Secondary | ICD-10-CM | POA: Diagnosis not present

## 2022-10-11 DIAGNOSIS — E119 Type 2 diabetes mellitus without complications: Secondary | ICD-10-CM | POA: Diagnosis not present

## 2022-11-01 DIAGNOSIS — E669 Obesity, unspecified: Secondary | ICD-10-CM | POA: Diagnosis not present

## 2022-11-01 DIAGNOSIS — Z6831 Body mass index (BMI) 31.0-31.9, adult: Secondary | ICD-10-CM | POA: Diagnosis not present

## 2022-11-01 DIAGNOSIS — H8110 Benign paroxysmal vertigo, unspecified ear: Secondary | ICD-10-CM | POA: Diagnosis not present

## 2022-11-01 DIAGNOSIS — N3000 Acute cystitis without hematuria: Secondary | ICD-10-CM | POA: Diagnosis not present

## 2023-01-06 DIAGNOSIS — R110 Nausea: Secondary | ICD-10-CM | POA: Diagnosis not present

## 2023-01-06 DIAGNOSIS — H8110 Benign paroxysmal vertigo, unspecified ear: Secondary | ICD-10-CM | POA: Diagnosis not present

## 2023-01-13 DIAGNOSIS — H8110 Benign paroxysmal vertigo, unspecified ear: Secondary | ICD-10-CM | POA: Diagnosis not present

## 2023-01-13 DIAGNOSIS — Z7982 Long term (current) use of aspirin: Secondary | ICD-10-CM | POA: Diagnosis not present

## 2023-01-13 DIAGNOSIS — Z87891 Personal history of nicotine dependence: Secondary | ICD-10-CM | POA: Diagnosis not present

## 2023-01-13 DIAGNOSIS — N189 Chronic kidney disease, unspecified: Secondary | ICD-10-CM | POA: Diagnosis not present

## 2023-01-13 DIAGNOSIS — I129 Hypertensive chronic kidney disease with stage 1 through stage 4 chronic kidney disease, or unspecified chronic kidney disease: Secondary | ICD-10-CM | POA: Diagnosis not present

## 2023-01-13 DIAGNOSIS — E119 Type 2 diabetes mellitus without complications: Secondary | ICD-10-CM | POA: Diagnosis not present

## 2023-01-13 DIAGNOSIS — R42 Dizziness and giddiness: Secondary | ICD-10-CM | POA: Diagnosis not present

## 2023-01-13 DIAGNOSIS — R9431 Abnormal electrocardiogram [ECG] [EKG]: Secondary | ICD-10-CM | POA: Diagnosis not present

## 2023-01-13 DIAGNOSIS — Z79899 Other long term (current) drug therapy: Secondary | ICD-10-CM | POA: Diagnosis not present

## 2023-01-21 DIAGNOSIS — E785 Hyperlipidemia, unspecified: Secondary | ICD-10-CM | POA: Diagnosis not present

## 2023-01-21 DIAGNOSIS — R42 Dizziness and giddiness: Secondary | ICD-10-CM | POA: Diagnosis not present

## 2023-01-21 DIAGNOSIS — R112 Nausea with vomiting, unspecified: Secondary | ICD-10-CM | POA: Diagnosis not present

## 2023-01-21 DIAGNOSIS — E1169 Type 2 diabetes mellitus with other specified complication: Secondary | ICD-10-CM | POA: Diagnosis not present

## 2023-01-21 DIAGNOSIS — Z6828 Body mass index (BMI) 28.0-28.9, adult: Secondary | ICD-10-CM | POA: Diagnosis not present

## 2023-02-22 IMAGING — CT CT HEART MORP W/ CTA COR W/ SCORE W/ CA W/CM &/OR W/O CM
4 of 7 series · 8 of 20 positions shown, 9 images · IV contrast (APPLIED)
Comparison: Prior CTA of the chest on 12/04/2020 at [facility]
[place]
COMPARISON: Prior CTA of the chest on 12/04/2020 at [facility]
[place]

Addendum:
EXAM:
OVER-READ INTERPRETATION  CT CHEST

The following report is an over-read performed by radiologist Dr.
Nakita Scriven [[email or web address]] on 04/23/2021. This
over-read does not include interpretation of cardiac or coronary
anatomy or pathology. The coronary CTA interpretation by the
cardiologist is attached.
CLINICAL DATA: 85 year old with chest pain
Cardiac/Coronary  CTA
TECHNIQUE: The patient was scanned on a [employer] Force scanner.

[Series 6: best diast · axial · 0.39mm/px · z∈[+1175,+1215]mm · 2 of 298 slices shown, 3 images]
[im 100/298  vessel]
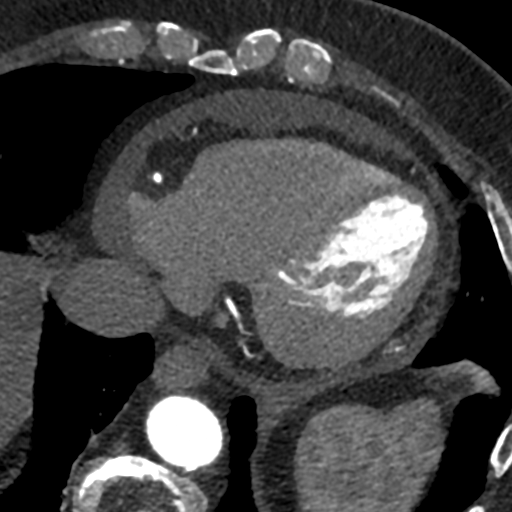
[im 100/298  lung]
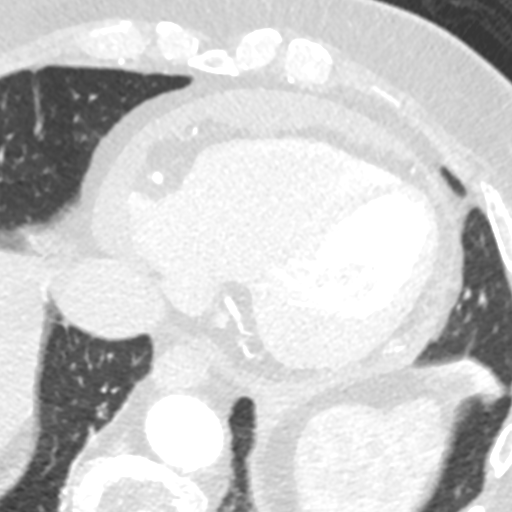
[im 199/298  vessel]
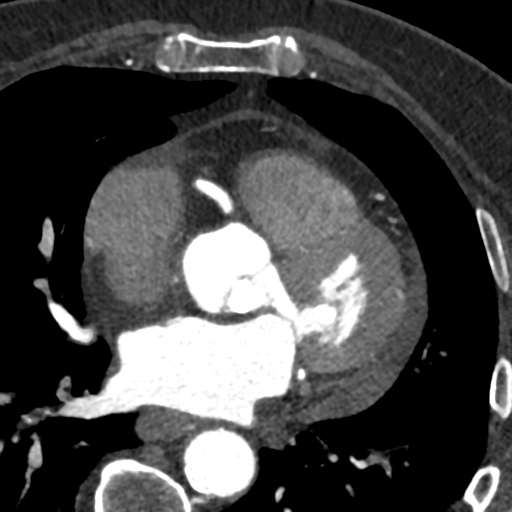

[Series 7: best syst · axial · 0.39mm/px · z∈[+1175,+1215]mm · 2 of 298 slices shown]
[im 100/298  vessel]
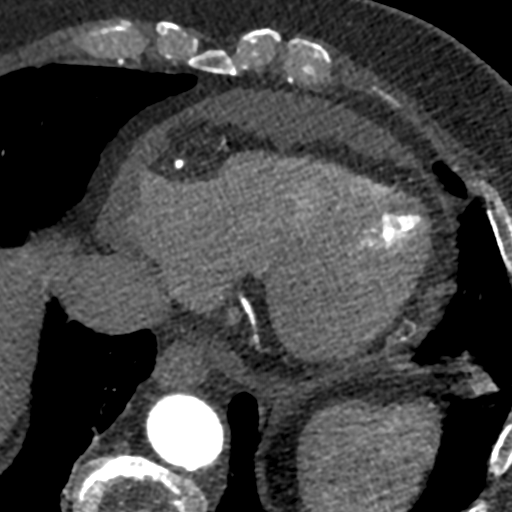
[im 199/298  vessel]
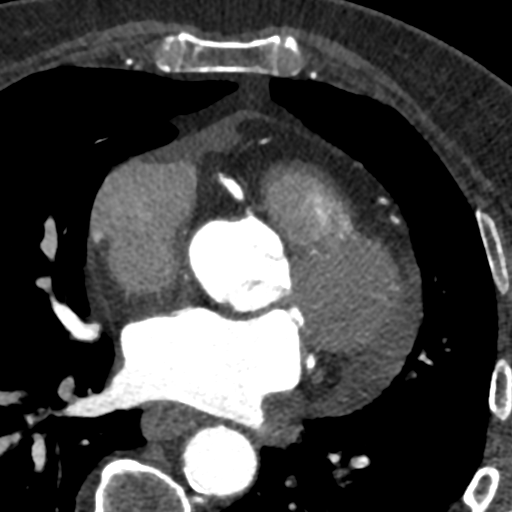

[Series 8: ts diast sharp · axial · 0.39mm/px · z∈[+1175,+1215]mm · 2 of 298 slices shown]
[im 100/298  lung]
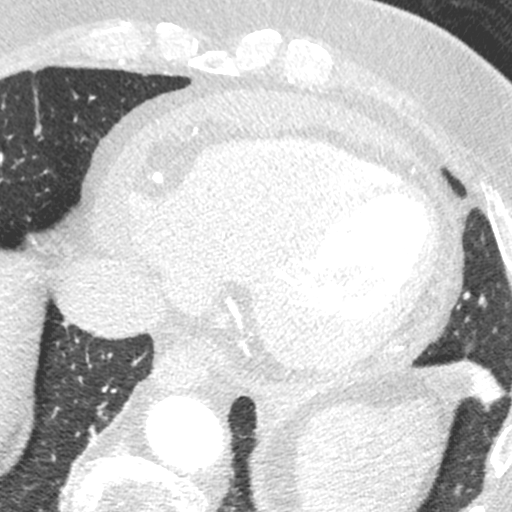
[im 199/298  lung]
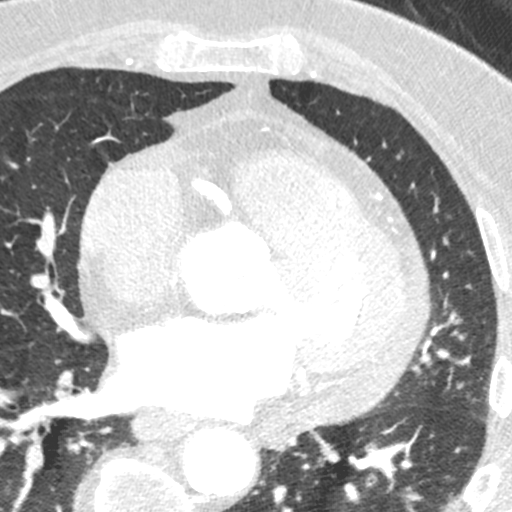

[Series 9: ts syst sharp · axial · 0.39mm/px · z∈[+1175,+1215]mm · 2 of 298 slices shown]
[im 100/298  lung]
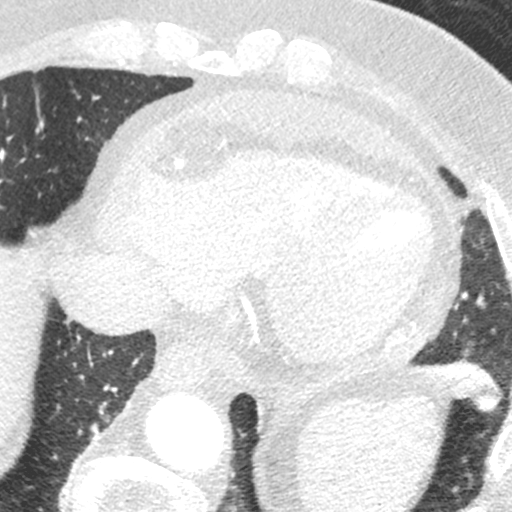
[im 199/298  lung]
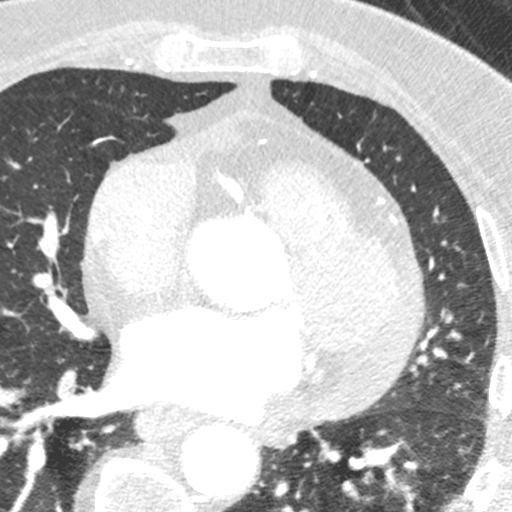

[8 of 20 positions shown; findings below may reference images not displayed]

FINDINGS: Vascular: Stable atherosclerosis of the descending thoracic aorta.

Mediastinum/Nodes: Visualized mediastinum and hilar regions
demonstrate no lymphadenopathy or masses.

Lungs/Pleura: There is no evidence of pulmonary edema,
consolidation, pneumothorax, nodule or pleural fluid.

Upper Abdomen: No acute abnormality.

Musculoskeletal: No chest wall mass or suspicious bone lesions
identified.
IMPRESSION: Stable atherosclerosis of the descending thoracic aorta.
FINDINGS: A 120 kV prospective scan was triggered in the descending thoracic
aorta at 111 HU's. Axial non-contrast 3 mm slices were carried out
through the heart. The data set was analyzed on a dedicated work
station and scored using the Agatson method. Gantry rotation speed
was 250 msecs and collimation was .6 mm. 0.8 mg of sl NTG was given.
The 3D data set was reconstructed in 5% intervals of the 67-82 % of
the R-R cycle. Diastolic phases were analyzed on a dedicated work
station using MPR, MIP and VRT modes. The patient received 80 cc of
contrast.

Image quality: good

Aorta:  Normal size.  No calcifications.  No dissection.

Aortic Valve:  Trileaflet.  No calcifications.

Coronary Arteries:  Normal coronary origin.  Right dominance.

RCA is a large dominant artery that gives rise to PDA and PLA. There
is mild calcified proximal plaque, 0-24%.

Left main is a large, short artery that gives rise to LAD and LCX
arteries.

LAD is a large vessel that has diffuse proximal calcified plaque,
(0-24%).

LCX is a non-dominant artery that gives rise to one large OM1
branch. There is no plaque.

Other findings:

Normal pulmonary vein drainage into the left atrium.

Normal left atrial appendage without a thrombus.

Normal size of the pulmonary artery.

There is a small circumferential pericardial effusion

Please see radiology report for non cardiac findings.
IMPRESSION: 1. Coronary calcium score of 146. This was 45 percentile for age and
sex matched control.

2. Normal coronary origin with right dominance.

3. Mild calcified plaque LAD, proximal RCA, 0-24%. Non flow
limiting.

4. Small circumferential pericardial effusion

*** End of Addendum ***
EXAM:
OVER-READ INTERPRETATION  CT CHEST

The following report is an over-read performed by radiologist Dr.
Nakita Scriven [REDACTED] on 04/23/2021. This
over-read does not include interpretation of cardiac or coronary
anatomy or pathology. The coronary CTA interpretation by the
cardiologist is attached.
FINDINGS: Vascular: Stable atherosclerosis of the descending thoracic aorta.

Mediastinum/Nodes: Visualized mediastinum and hilar regions
demonstrate no lymphadenopathy or masses.

Lungs/Pleura: There is no evidence of pulmonary edema,
consolidation, pneumothorax, nodule or pleural fluid.

Upper Abdomen: No acute abnormality.

Musculoskeletal: No chest wall mass or suspicious bone lesions
identified.
IMPRESSION: Stable atherosclerosis of the descending thoracic aorta.

## 2023-03-10 DIAGNOSIS — Z6829 Body mass index (BMI) 29.0-29.9, adult: Secondary | ICD-10-CM | POA: Diagnosis not present

## 2023-03-10 DIAGNOSIS — R413 Other amnesia: Secondary | ICD-10-CM | POA: Diagnosis not present

## 2023-03-10 DIAGNOSIS — E1169 Type 2 diabetes mellitus with other specified complication: Secondary | ICD-10-CM | POA: Diagnosis not present

## 2023-03-10 DIAGNOSIS — E538 Deficiency of other specified B group vitamins: Secondary | ICD-10-CM | POA: Diagnosis not present

## 2023-03-10 DIAGNOSIS — E785 Hyperlipidemia, unspecified: Secondary | ICD-10-CM | POA: Diagnosis not present

## 2023-03-10 DIAGNOSIS — K219 Gastro-esophageal reflux disease without esophagitis: Secondary | ICD-10-CM | POA: Diagnosis not present

## 2023-03-10 DIAGNOSIS — R42 Dizziness and giddiness: Secondary | ICD-10-CM | POA: Diagnosis not present

## 2023-03-10 DIAGNOSIS — L02412 Cutaneous abscess of left axilla: Secondary | ICD-10-CM | POA: Diagnosis not present

## 2023-03-10 DIAGNOSIS — E559 Vitamin D deficiency, unspecified: Secondary | ICD-10-CM | POA: Diagnosis not present

## 2023-03-12 DIAGNOSIS — R42 Dizziness and giddiness: Secondary | ICD-10-CM | POA: Diagnosis not present

## 2023-03-13 DIAGNOSIS — R42 Dizziness and giddiness: Secondary | ICD-10-CM | POA: Diagnosis not present

## 2023-03-13 DIAGNOSIS — H539 Unspecified visual disturbance: Secondary | ICD-10-CM | POA: Diagnosis not present

## 2023-03-18 DIAGNOSIS — L02412 Cutaneous abscess of left axilla: Secondary | ICD-10-CM | POA: Diagnosis not present

## 2023-04-01 DIAGNOSIS — L02412 Cutaneous abscess of left axilla: Secondary | ICD-10-CM | POA: Diagnosis not present

## 2023-06-11 DIAGNOSIS — E1169 Type 2 diabetes mellitus with other specified complication: Secondary | ICD-10-CM | POA: Diagnosis not present

## 2023-06-11 DIAGNOSIS — E538 Deficiency of other specified B group vitamins: Secondary | ICD-10-CM | POA: Diagnosis not present

## 2023-06-11 DIAGNOSIS — Z125 Encounter for screening for malignant neoplasm of prostate: Secondary | ICD-10-CM | POA: Diagnosis not present

## 2023-06-11 DIAGNOSIS — R42 Dizziness and giddiness: Secondary | ICD-10-CM | POA: Diagnosis not present

## 2023-06-11 DIAGNOSIS — R252 Cramp and spasm: Secondary | ICD-10-CM | POA: Diagnosis not present

## 2023-06-11 DIAGNOSIS — K219 Gastro-esophageal reflux disease without esophagitis: Secondary | ICD-10-CM | POA: Diagnosis not present

## 2023-06-11 DIAGNOSIS — E785 Hyperlipidemia, unspecified: Secondary | ICD-10-CM | POA: Diagnosis not present

## 2023-06-11 DIAGNOSIS — R413 Other amnesia: Secondary | ICD-10-CM | POA: Diagnosis not present

## 2023-09-11 DIAGNOSIS — R42 Dizziness and giddiness: Secondary | ICD-10-CM | POA: Diagnosis not present

## 2023-09-11 DIAGNOSIS — R413 Other amnesia: Secondary | ICD-10-CM | POA: Diagnosis not present

## 2023-09-11 DIAGNOSIS — K219 Gastro-esophageal reflux disease without esophagitis: Secondary | ICD-10-CM | POA: Diagnosis not present

## 2023-09-11 DIAGNOSIS — E1169 Type 2 diabetes mellitus with other specified complication: Secondary | ICD-10-CM | POA: Diagnosis not present

## 2023-09-11 DIAGNOSIS — R3912 Poor urinary stream: Secondary | ICD-10-CM | POA: Diagnosis not present

## 2023-09-11 DIAGNOSIS — H6191 Disorder of right external ear, unspecified: Secondary | ICD-10-CM | POA: Diagnosis not present

## 2023-09-11 DIAGNOSIS — E785 Hyperlipidemia, unspecified: Secondary | ICD-10-CM | POA: Diagnosis not present

## 2023-09-26 DIAGNOSIS — L820 Inflamed seborrheic keratosis: Secondary | ICD-10-CM | POA: Diagnosis not present

## 2023-09-26 DIAGNOSIS — L578 Other skin changes due to chronic exposure to nonionizing radiation: Secondary | ICD-10-CM | POA: Diagnosis not present

## 2023-09-26 DIAGNOSIS — C44212 Basal cell carcinoma of skin of right ear and external auricular canal: Secondary | ICD-10-CM | POA: Diagnosis not present

## 2023-09-26 DIAGNOSIS — L814 Other melanin hyperpigmentation: Secondary | ICD-10-CM | POA: Diagnosis not present
